# Patient Record
Sex: Male | Born: 1935
Health system: Southern US, Community
[De-identification: ages and names within clinical notes are randomized; demographics above are authoritative.]

## PROBLEM LIST (undated history)

## (undated) DIAGNOSIS — C7951 Secondary malignant neoplasm of bone: Secondary | ICD-10-CM

## (undated) DIAGNOSIS — I10 Essential (primary) hypertension: Secondary | ICD-10-CM

## (undated) DIAGNOSIS — R42 Dizziness and giddiness: Secondary | ICD-10-CM

## (undated) DIAGNOSIS — Z8546 Personal history of malignant neoplasm of prostate: Secondary | ICD-10-CM

## (undated) DIAGNOSIS — M810 Age-related osteoporosis without current pathological fracture: Secondary | ICD-10-CM

## (undated) DIAGNOSIS — R011 Cardiac murmur, unspecified: Secondary | ICD-10-CM

## (undated) HISTORY — DX: Essential (primary) hypertension: I10

## (undated) HISTORY — DX: Secondary malignant neoplasm of bone: C79.51

## (undated) HISTORY — PX: BIOPSY PROSTATE: PRO28

## (undated) HISTORY — DX: Cardiac murmur, unspecified: R01.1

## (undated) HISTORY — DX: Personal history of malignant neoplasm of prostate: Z85.46

## (undated) HISTORY — DX: Dizziness and giddiness: R42

## (undated) HISTORY — DX: Age-related osteoporosis without current pathological fracture: M81.0

---

## 1999-10-10 DIAGNOSIS — Z8546 Personal history of malignant neoplasm of prostate: Secondary | ICD-10-CM

## 1999-10-10 HISTORY — DX: Personal history of malignant neoplasm of prostate: Z85.46

## 2012-04-18 ENCOUNTER — Encounter: Payer: Medicare Other | Admitting: Hematology and Oncology

## 2012-04-18 DIAGNOSIS — I10 Essential (primary) hypertension: Secondary | ICD-10-CM

## 2012-04-18 DIAGNOSIS — M810 Age-related osteoporosis without current pathological fracture: Secondary | ICD-10-CM

## 2012-04-18 DIAGNOSIS — C7951 Secondary malignant neoplasm of bone: Secondary | ICD-10-CM

## 2012-04-18 DIAGNOSIS — C7952 Secondary malignant neoplasm of bone marrow: Secondary | ICD-10-CM

## 2012-04-18 DIAGNOSIS — C61 Malignant neoplasm of prostate: Secondary | ICD-10-CM

## 2012-06-25 ENCOUNTER — Encounter: Payer: Medicare Other | Admitting: Hematology and Oncology

## 2012-07-03 ENCOUNTER — Encounter: Payer: Medicare Other | Admitting: Hematology and Oncology

## 2012-07-03 DIAGNOSIS — I10 Essential (primary) hypertension: Secondary | ICD-10-CM

## 2012-07-03 DIAGNOSIS — C61 Malignant neoplasm of prostate: Secondary | ICD-10-CM

## 2012-07-03 DIAGNOSIS — M810 Age-related osteoporosis without current pathological fracture: Secondary | ICD-10-CM

## 2012-07-30 DIAGNOSIS — C61 Malignant neoplasm of prostate: Secondary | ICD-10-CM

## 2012-07-30 DIAGNOSIS — M810 Age-related osteoporosis without current pathological fracture: Secondary | ICD-10-CM

## 2012-10-16 ENCOUNTER — Encounter: Payer: Medicare Other | Admitting: Internal Medicine

## 2012-10-16 DIAGNOSIS — C61 Malignant neoplasm of prostate: Secondary | ICD-10-CM

## 2013-01-03 DIAGNOSIS — C7951 Secondary malignant neoplasm of bone: Secondary | ICD-10-CM

## 2013-01-03 DIAGNOSIS — C7952 Secondary malignant neoplasm of bone marrow: Secondary | ICD-10-CM

## 2013-01-03 DIAGNOSIS — C61 Malignant neoplasm of prostate: Secondary | ICD-10-CM

## 2013-01-16 DIAGNOSIS — C61 Malignant neoplasm of prostate: Secondary | ICD-10-CM

## 2013-01-16 DIAGNOSIS — M810 Age-related osteoporosis without current pathological fracture: Secondary | ICD-10-CM

## 2013-01-16 DIAGNOSIS — Z5111 Encounter for antineoplastic chemotherapy: Secondary | ICD-10-CM

## 2013-04-07 ENCOUNTER — Encounter: Payer: Medicare Other | Admitting: Internal Medicine

## 2013-04-07 DIAGNOSIS — M810 Age-related osteoporosis without current pathological fracture: Secondary | ICD-10-CM

## 2013-04-07 DIAGNOSIS — Z5111 Encounter for antineoplastic chemotherapy: Secondary | ICD-10-CM

## 2013-04-07 DIAGNOSIS — C7952 Secondary malignant neoplasm of bone marrow: Secondary | ICD-10-CM

## 2013-04-07 DIAGNOSIS — C61 Malignant neoplasm of prostate: Secondary | ICD-10-CM

## 2013-04-07 DIAGNOSIS — C7951 Secondary malignant neoplasm of bone: Secondary | ICD-10-CM

## 2013-06-30 DIAGNOSIS — Z5111 Encounter for antineoplastic chemotherapy: Secondary | ICD-10-CM

## 2013-06-30 DIAGNOSIS — C7952 Secondary malignant neoplasm of bone marrow: Secondary | ICD-10-CM

## 2013-06-30 DIAGNOSIS — C7951 Secondary malignant neoplasm of bone: Secondary | ICD-10-CM

## 2013-06-30 DIAGNOSIS — C61 Malignant neoplasm of prostate: Secondary | ICD-10-CM

## 2014-12-30 DIAGNOSIS — D485 Neoplasm of uncertain behavior of skin: Secondary | ICD-10-CM | POA: Diagnosis not present

## 2014-12-30 DIAGNOSIS — L821 Other seborrheic keratosis: Secondary | ICD-10-CM | POA: Diagnosis not present

## 2014-12-30 DIAGNOSIS — L570 Actinic keratosis: Secondary | ICD-10-CM | POA: Diagnosis not present

## 2014-12-31 DIAGNOSIS — C61 Malignant neoplasm of prostate: Secondary | ICD-10-CM | POA: Diagnosis not present

## 2014-12-31 DIAGNOSIS — C7951 Secondary malignant neoplasm of bone: Secondary | ICD-10-CM | POA: Diagnosis not present

## 2014-12-31 DIAGNOSIS — M810 Age-related osteoporosis without current pathological fracture: Secondary | ICD-10-CM | POA: Diagnosis not present

## 2015-04-02 DIAGNOSIS — C7951 Secondary malignant neoplasm of bone: Secondary | ICD-10-CM | POA: Diagnosis not present

## 2015-04-02 DIAGNOSIS — C61 Malignant neoplasm of prostate: Secondary | ICD-10-CM | POA: Diagnosis not present

## 2015-07-02 DIAGNOSIS — M810 Age-related osteoporosis without current pathological fracture: Secondary | ICD-10-CM | POA: Diagnosis not present

## 2015-07-02 DIAGNOSIS — C61 Malignant neoplasm of prostate: Secondary | ICD-10-CM | POA: Diagnosis not present

## 2015-07-02 DIAGNOSIS — C7951 Secondary malignant neoplasm of bone: Secondary | ICD-10-CM | POA: Diagnosis not present

## 2015-07-07 DIAGNOSIS — M810 Age-related osteoporosis without current pathological fracture: Secondary | ICD-10-CM | POA: Diagnosis not present

## 2015-07-07 DIAGNOSIS — C61 Malignant neoplasm of prostate: Secondary | ICD-10-CM | POA: Diagnosis not present

## 2015-07-07 DIAGNOSIS — C7951 Secondary malignant neoplasm of bone: Secondary | ICD-10-CM | POA: Diagnosis not present

## 2015-07-07 DIAGNOSIS — Z23 Encounter for immunization: Secondary | ICD-10-CM | POA: Diagnosis not present

## 2015-07-19 DIAGNOSIS — Z87891 Personal history of nicotine dependence: Secondary | ICD-10-CM | POA: Diagnosis not present

## 2015-07-19 DIAGNOSIS — C61 Malignant neoplasm of prostate: Secondary | ICD-10-CM | POA: Diagnosis not present

## 2015-07-19 DIAGNOSIS — I10 Essential (primary) hypertension: Secondary | ICD-10-CM | POA: Diagnosis not present

## 2015-07-19 DIAGNOSIS — Z923 Personal history of irradiation: Secondary | ICD-10-CM | POA: Diagnosis not present

## 2015-07-19 DIAGNOSIS — Z9221 Personal history of antineoplastic chemotherapy: Secondary | ICD-10-CM | POA: Diagnosis not present

## 2015-07-19 DIAGNOSIS — M810 Age-related osteoporosis without current pathological fracture: Secondary | ICD-10-CM | POA: Diagnosis not present

## 2015-10-01 DIAGNOSIS — Z5111 Encounter for antineoplastic chemotherapy: Secondary | ICD-10-CM | POA: Diagnosis not present

## 2015-10-01 DIAGNOSIS — C61 Malignant neoplasm of prostate: Secondary | ICD-10-CM | POA: Diagnosis not present

## 2015-12-30 DIAGNOSIS — Z5111 Encounter for antineoplastic chemotherapy: Secondary | ICD-10-CM | POA: Diagnosis not present

## 2015-12-30 DIAGNOSIS — Z5181 Encounter for therapeutic drug level monitoring: Secondary | ICD-10-CM | POA: Diagnosis not present

## 2015-12-30 DIAGNOSIS — M810 Age-related osteoporosis without current pathological fracture: Secondary | ICD-10-CM | POA: Diagnosis not present

## 2015-12-30 DIAGNOSIS — Z79899 Other long term (current) drug therapy: Secondary | ICD-10-CM | POA: Diagnosis not present

## 2015-12-30 DIAGNOSIS — C61 Malignant neoplasm of prostate: Secondary | ICD-10-CM | POA: Diagnosis not present

## 2015-12-30 DIAGNOSIS — C7951 Secondary malignant neoplasm of bone: Secondary | ICD-10-CM | POA: Diagnosis not present

## 2016-03-29 DIAGNOSIS — Z5181 Encounter for therapeutic drug level monitoring: Secondary | ICD-10-CM | POA: Diagnosis not present

## 2016-03-29 DIAGNOSIS — Z79899 Other long term (current) drug therapy: Secondary | ICD-10-CM | POA: Diagnosis not present

## 2016-03-29 DIAGNOSIS — M818 Other osteoporosis without current pathological fracture: Secondary | ICD-10-CM | POA: Diagnosis not present

## 2016-03-29 DIAGNOSIS — C61 Malignant neoplasm of prostate: Secondary | ICD-10-CM | POA: Diagnosis not present

## 2016-03-29 DIAGNOSIS — R339 Retention of urine, unspecified: Secondary | ICD-10-CM | POA: Diagnosis not present

## 2016-03-29 DIAGNOSIS — Z5111 Encounter for antineoplastic chemotherapy: Secondary | ICD-10-CM | POA: Diagnosis not present

## 2016-04-24 DIAGNOSIS — R5383 Other fatigue: Secondary | ICD-10-CM | POA: Diagnosis not present

## 2016-04-24 DIAGNOSIS — C61 Malignant neoplasm of prostate: Secondary | ICD-10-CM | POA: Diagnosis not present

## 2016-04-24 DIAGNOSIS — Z6821 Body mass index (BMI) 21.0-21.9, adult: Secondary | ICD-10-CM | POA: Diagnosis not present

## 2016-04-24 DIAGNOSIS — M810 Age-related osteoporosis without current pathological fracture: Secondary | ICD-10-CM | POA: Diagnosis not present

## 2016-04-24 DIAGNOSIS — I10 Essential (primary) hypertension: Secondary | ICD-10-CM | POA: Diagnosis not present

## 2016-04-24 DIAGNOSIS — I493 Ventricular premature depolarization: Secondary | ICD-10-CM | POA: Diagnosis not present

## 2016-05-01 DIAGNOSIS — R3912 Poor urinary stream: Secondary | ICD-10-CM | POA: Diagnosis not present

## 2016-05-01 DIAGNOSIS — M8589 Other specified disorders of bone density and structure, multiple sites: Secondary | ICD-10-CM | POA: Diagnosis not present

## 2016-05-01 DIAGNOSIS — C61 Malignant neoplasm of prostate: Secondary | ICD-10-CM | POA: Diagnosis not present

## 2016-06-19 DIAGNOSIS — Z6821 Body mass index (BMI) 21.0-21.9, adult: Secondary | ICD-10-CM | POA: Diagnosis not present

## 2016-06-19 DIAGNOSIS — Z1389 Encounter for screening for other disorder: Secondary | ICD-10-CM | POA: Diagnosis not present

## 2016-06-19 DIAGNOSIS — I493 Ventricular premature depolarization: Secondary | ICD-10-CM | POA: Diagnosis not present

## 2016-06-19 DIAGNOSIS — Z23 Encounter for immunization: Secondary | ICD-10-CM | POA: Diagnosis not present

## 2016-06-19 DIAGNOSIS — R5383 Other fatigue: Secondary | ICD-10-CM | POA: Diagnosis not present

## 2016-06-19 DIAGNOSIS — C61 Malignant neoplasm of prostate: Secondary | ICD-10-CM | POA: Diagnosis not present

## 2016-06-19 DIAGNOSIS — M810 Age-related osteoporosis without current pathological fracture: Secondary | ICD-10-CM | POA: Diagnosis not present

## 2016-06-19 DIAGNOSIS — I10 Essential (primary) hypertension: Secondary | ICD-10-CM | POA: Diagnosis not present

## 2016-06-21 DIAGNOSIS — C44319 Basal cell carcinoma of skin of other parts of face: Secondary | ICD-10-CM | POA: Diagnosis not present

## 2016-06-30 DIAGNOSIS — C61 Malignant neoplasm of prostate: Secondary | ICD-10-CM | POA: Diagnosis not present

## 2016-07-04 DIAGNOSIS — R3912 Poor urinary stream: Secondary | ICD-10-CM | POA: Diagnosis not present

## 2016-07-04 DIAGNOSIS — C61 Malignant neoplasm of prostate: Secondary | ICD-10-CM | POA: Diagnosis not present

## 2016-07-12 ENCOUNTER — Ambulatory Visit (INDEPENDENT_AMBULATORY_CARE_PROVIDER_SITE_OTHER): Payer: Medicare Other | Admitting: Cardiovascular Disease

## 2016-07-12 ENCOUNTER — Other Ambulatory Visit: Payer: Self-pay | Admitting: *Deleted

## 2016-07-12 ENCOUNTER — Encounter: Payer: Self-pay | Admitting: *Deleted

## 2016-07-12 VITALS — BP 90/58 | HR 96 | Ht 68.0 in | Wt 137.0 lb

## 2016-07-12 DIAGNOSIS — R011 Cardiac murmur, unspecified: Secondary | ICD-10-CM

## 2016-07-12 DIAGNOSIS — C61 Malignant neoplasm of prostate: Secondary | ICD-10-CM | POA: Diagnosis not present

## 2016-07-12 DIAGNOSIS — I951 Orthostatic hypotension: Secondary | ICD-10-CM

## 2016-07-12 DIAGNOSIS — R42 Dizziness and giddiness: Secondary | ICD-10-CM

## 2016-07-12 DIAGNOSIS — I493 Ventricular premature depolarization: Secondary | ICD-10-CM | POA: Diagnosis not present

## 2016-07-12 NOTE — Patient Instructions (Addendum)
Medication Instructions:   Stop Lasix (Furosemide).  Stop Potassium (K-Dur, Klor-Con).  Stop Flomax (Tamsulosin).  Continue all other medications.    Labwork: none  Testing/Procedures:  Your physician has requested that you have an echocardiogram. Echocardiography is a painless test that uses sound waves to create images of your heart. It provides your doctor with information about the size and shape of your heart and how well your heart's chambers and valves are working. This procedure takes approximately one hour. There are no restrictions for this procedure.  Office will contact with results via phone or letter.    Follow-Up: 2 months   Any Other Special Instructions Will Be Listed Below (If Applicable).  If you need a refill on your cardiac medications before your next appointment, please call your pharmacy.

## 2016-07-12 NOTE — Progress Notes (Signed)
CARDIOLOGY CONSULT NOTE  Patient ID: Bryan Ho MRN: [id number] DOB/AGE: 05/16/1923 82 y.o.  Admit date: (Not on file) Primary Physician: Manon Hilding, MD Referring Physician:   Reason for Consultation: dizziness  HPI: 82 yr old referred for dizziness. ECG in July showed NSR with PVC's occasionally in couplets.  Reportedly started on Flomax by urology and dizziness became much worse. He denies shortness of breath, chest pain, palpitations, and syncope. He was started on Lasix 40 mg 7 or 8 years ago and this dose hasn't changed. He wears compression stockings. He has a history of prostate cancer diagnosed in the 41s. He has also been on long-acting diltiazem twice daily and says he has been on the same dose for many, many years.  Blood pressure sitting 121/73, standing 90/58, heart rate 96 bpm.    No Known Allergies  Current Outpatient Prescriptions  Medication Sig Dispense Refill  . bicalutamide (CASODEX) 50 MG tablet Take 1 tablet by mouth daily.    Marland Kitchen DILT-XR 180 MG 24 hr capsule Take 1 capsule by mouth 2 (two) times daily.    . Melatonin 10 MG TABS Take 1 tablet by mouth at bedtime.     No current facility-administered medications for this visit.     Past Medical History:  Diagnosis Date  . Bone metastases ([place])   . Cardiac murmur   . Dizziness and giddiness   . History of adenocarcinoma of prostate 2001  . Hypertension   . Osteoporosis     Past Surgical History:  Procedure Laterality Date  . BIOPSY PROSTATE     20 years ago, cancer bx    Social History   Social History  . Marital status: Married    Spouse name: N/A  . Number of children: N/A  . Years of education: N/A   Occupational History  . Not on file.   Social History Main Topics  . Smoking status: Former Smoker    Packs/day: 3.00    Years: 40.00    Types: Cigarettes    Start date: 06/12/1937    Quit date: 06/12/1977  . Smokeless tobacco: Current User     Comment: starting dipping  snuff at age 50   . Alcohol use Not on file  . Drug use: Unknown  . Sexual activity: Not on file   Other Topics Concern  . Not on file   Social History Narrative  . No narrative on file     No family history of premature CAD in 1st degree relatives.  Prior to Admission medications   Medication Sig Start Date End Date Taking? Authorizing Provider  bicalutamide (CASODEX) 50 MG tablet Take 1 tablet by mouth daily. 07/04/16   Historical Provider, MD  DILT-XR 180 MG 24 hr capsule Take 1 capsule by mouth 2 (two) times daily. 06/21/16   Historical Provider, MD  furosemide (LASIX) 40 MG tablet Take 1 tablet by mouth daily. 06/20/16   Historical Provider, MD  potassium chloride SA (K-DUR,KLOR-CON) 20 MEQ tablet Take 1 tablet by mouth daily. 05/26/16   Historical Provider, MD  tamsulosin (FLOMAX) 0.4 MG CAPS capsule Take 1 capsule by mouth daily. 05/01/16   Historical Provider, MD     Review of systems complete and found to be negative unless listed above in HPI     Physical exam Blood pressure (!) 90/58, pulse 96, height 5\' 8"  (1.727 m), weight 137 lb (62.1 kg). General: NAD Neck: No JVD, no thyromegaly or thyroid nodule.  Lungs: Clear to auscultation bilaterally with normal respiratory effort. CV: Nondisplaced PMI. Distant tones. Regular rate and mostly regular rhythm with premature contractions, normal S1/S2, no S3/S4, 2/6 ejection systolic murmur over RUSB.  Trace peripheral edema. Abdomen: Soft, nontender, no distention.  Skin: Intact without lesions or rashes.  Neurologic: Alert and oriented.  Psych: Normal affect.  ECG: Most recent ECG reviewed.  Labs:  No results found for: WBC, HGB, HCT, MCV, PLT No results for input(s): NA, K, CL, CO2, BUN, CREATININE, CALCIUM, PROT, BILITOT, ALKPHOS, ALT, AST, GLUCOSE in the last 168 hours.  Invalid input(s): LABALBU No results found for: CKTOTAL, CKMB, CKMBINDEX, TROPONINI No results found for: CHOL No results found for: HDL No results  found for: LDLCALC No results found for: TRIG No results found for: CHOLHDL No results found for: LDLDIRECT       Studies: No results found.  ASSESSMENT AND PLAN:  1. Dizziness/hypotension: Orthostatics noted above. On both Lasix 40 mg daily and long acting diltazem 180 mg BID. Symptoms became worse with Flomax. I have encouraged him to stop both Lasix and Flomax.  I will order a 2-D echocardiogram with Doppler to evaluate cardiac structure, function, and regional wall motion.  2. Cardiac murmur: I will order a 2-D echocardiogram with Doppler to evaluate cardiac structure, function, and regional wall motion.  3. PVC's: Asymptomatic on diltiazem.   Dispo: fu 2 months.     Signed: Kate Sable, M.D., F.A.C.C.  07/12/2016, 2:22 PM

## 2016-08-03 ENCOUNTER — Ambulatory Visit (INDEPENDENT_AMBULATORY_CARE_PROVIDER_SITE_OTHER): Payer: Medicare Other

## 2016-08-03 ENCOUNTER — Other Ambulatory Visit: Payer: Self-pay

## 2016-08-03 DIAGNOSIS — R011 Cardiac murmur, unspecified: Secondary | ICD-10-CM | POA: Diagnosis not present

## 2016-08-08 ENCOUNTER — Telehealth: Payer: Self-pay | Admitting: *Deleted

## 2016-08-08 NOTE — Telephone Encounter (Signed)
Notes Recorded by Laurine Blazer, LPN on [id number] at 10:15 AM EDT Bryan Ho (wife) notified. Copy to pmd. Follow up scheduled for 09/11/2016 with Dr. Bronson Ing. ------  Notes Recorded by Arnoldo Lenis, MD on 08/07/2016 at 3:15 PM EDT Echo overeall looks good. He has mild to moderate changes of 2 heart valves that are not of a concern at this time and will be continued to be monitored. Dr Raliegh Ip to discuss further at f/u

## 2016-09-11 ENCOUNTER — Ambulatory Visit (INDEPENDENT_AMBULATORY_CARE_PROVIDER_SITE_OTHER): Payer: Medicare Other | Admitting: Cardiovascular Disease

## 2016-09-11 ENCOUNTER — Encounter: Payer: Self-pay | Admitting: Cardiovascular Disease

## 2016-09-11 VITALS — BP 136/72 | HR 87 | Ht 68.0 in | Wt 147.0 lb

## 2016-09-11 DIAGNOSIS — I350 Nonrheumatic aortic (valve) stenosis: Secondary | ICD-10-CM | POA: Diagnosis not present

## 2016-09-11 DIAGNOSIS — I951 Orthostatic hypotension: Secondary | ICD-10-CM | POA: Diagnosis not present

## 2016-09-11 DIAGNOSIS — R42 Dizziness and giddiness: Secondary | ICD-10-CM | POA: Diagnosis not present

## 2016-09-11 DIAGNOSIS — I493 Ventricular premature depolarization: Secondary | ICD-10-CM

## 2016-09-11 NOTE — Patient Instructions (Signed)
Medication Instructions:  Continue all current medications.  Labwork: none  Testing/Procedures: none  Follow-Up: As needed.    Any Other Special Instructions Will Be Listed Below (If Applicable).  If you need a refill on your cardiac medications before your next appointment, please call your pharmacy.  

## 2016-09-11 NOTE — Progress Notes (Signed)
      SUBJECTIVE: The patient returns for follow-up after undergoing echocardiography. This showed normal left ventricular systolic function and regional wall motion, LVEF 55-60%, mild to moderate aortic stenosis, mild mitral regurgitation, and moderately elevated pulmonary pressures 50 mmHg.  He stopped taking Lasix and he is now taking Flomax every other day and his dizziness has markedly improved.   Review of Systems: As per "subjective", otherwise negative.  No Known Allergies  Current Outpatient Prescriptions  Medication Sig Dispense Refill  . DILT-XR 180 MG 24 hr capsule Take 1 capsule by mouth 2 (two) times daily.    . tamsulosin (FLOMAX) 0.4 MG CAPS capsule Take 1 capsule by mouth daily.     No current facility-administered medications for this visit.     Past Medical History:  Diagnosis Date  . Bone metastases ([place])   . Cardiac murmur   . Dizziness and giddiness   . History of adenocarcinoma of prostate 2001  . Hypertension   . Osteoporosis     Past Surgical History:  Procedure Laterality Date  . BIOPSY PROSTATE     20 years ago, cancer bx    Social History   Social History  . Marital status: Married    Spouse name: N/A  . Number of children: N/A  . Years of education: N/A   Occupational History  . Not on file.   Social History Main Topics  . Smoking status: Former Smoker    Packs/day: 3.00    Years: 40.00    Types: Cigarettes    Start date: 06/12/1937    Quit date: 06/12/1977  . Smokeless tobacco: Current User    Types: Snuff     Comment: starting dipping snuff at age 49   . Alcohol use Not on file  . Drug use: Unknown  . Sexual activity: Not on file   Other Topics Concern  . Not on file   Social History Narrative  . No narrative on file     Vitals:   09/11/16 1322  BP: 136/72  Pulse: 87  Weight: 147 lb (66.7 kg)  Height: 5\' 8"  (1.727 m)    PHYSICAL EXAM General: NAD Neck: No JVD, no thyromegaly or thyroid nodule.  Lungs: Clear to  auscultation bilaterally with normal respiratory effort. CV: Nondisplaced PMI. Distant tones. Regular rate and mostly regular rhythm with premature contractions, normal S1/S2, no S3/S4, 2/6 ejection systolic murmur over RUSB.  Trace peripheral edema. Abdomen: Soft, nontender, no distention.  Skin: Intact without lesions or rashes.  Neurologic: Alert and oriented.  Psych: Normal affect.     ECG: Most recent ECG reviewed.      ASSESSMENT AND PLAN: 1. Dizziness/hypotension: Markedly improved with medication adjustments noted above.  2. Mild to moderate aortic stenosis: Echo can be repeated in 2-3 years if and when deemed clinically indicated.  3. PVC's: Asymptomatic on diltiazem.   Dispo: fu prn  Kate Sable, M.D., F.A.C.C.

## 2016-09-19 DIAGNOSIS — R42 Dizziness and giddiness: Secondary | ICD-10-CM | POA: Diagnosis not present

## 2016-09-19 DIAGNOSIS — H40033 Anatomical narrow angle, bilateral: Secondary | ICD-10-CM | POA: Diagnosis not present

## 2016-09-19 DIAGNOSIS — H04123 Dry eye syndrome of bilateral lacrimal glands: Secondary | ICD-10-CM | POA: Diagnosis not present

## 2016-09-19 DIAGNOSIS — R5383 Other fatigue: Secondary | ICD-10-CM | POA: Diagnosis not present

## 2016-09-19 DIAGNOSIS — M810 Age-related osteoporosis without current pathological fracture: Secondary | ICD-10-CM | POA: Diagnosis not present

## 2016-09-19 DIAGNOSIS — I10 Essential (primary) hypertension: Secondary | ICD-10-CM | POA: Diagnosis not present

## 2016-09-21 DIAGNOSIS — I10 Essential (primary) hypertension: Secondary | ICD-10-CM | POA: Diagnosis not present

## 2016-09-21 DIAGNOSIS — I493 Ventricular premature depolarization: Secondary | ICD-10-CM | POA: Diagnosis not present

## 2016-09-21 DIAGNOSIS — R011 Cardiac murmur, unspecified: Secondary | ICD-10-CM | POA: Diagnosis not present

## 2016-09-21 DIAGNOSIS — R5383 Other fatigue: Secondary | ICD-10-CM | POA: Diagnosis not present

## 2016-09-21 DIAGNOSIS — M810 Age-related osteoporosis without current pathological fracture: Secondary | ICD-10-CM | POA: Diagnosis not present

## 2016-09-21 DIAGNOSIS — R42 Dizziness and giddiness: Secondary | ICD-10-CM | POA: Diagnosis not present

## 2016-09-21 DIAGNOSIS — C61 Malignant neoplasm of prostate: Secondary | ICD-10-CM | POA: Diagnosis not present

## 2016-09-21 DIAGNOSIS — Z23 Encounter for immunization: Secondary | ICD-10-CM | POA: Diagnosis not present

## 2016-10-17 DIAGNOSIS — R972 Elevated prostate specific antigen [PSA]: Secondary | ICD-10-CM | POA: Diagnosis not present

## 2016-10-17 DIAGNOSIS — C61 Malignant neoplasm of prostate: Secondary | ICD-10-CM | POA: Diagnosis not present

## 2016-10-17 DIAGNOSIS — R3912 Poor urinary stream: Secondary | ICD-10-CM | POA: Diagnosis not present

## 2016-10-24 DIAGNOSIS — M8589 Other specified disorders of bone density and structure, multiple sites: Secondary | ICD-10-CM | POA: Diagnosis not present

## 2016-10-24 DIAGNOSIS — C61 Malignant neoplasm of prostate: Secondary | ICD-10-CM | POA: Diagnosis not present

## 2016-11-13 DIAGNOSIS — S32010A Wedge compression fracture of first lumbar vertebra, initial encounter for closed fracture: Secondary | ICD-10-CM | POA: Diagnosis not present

## 2016-11-13 DIAGNOSIS — Z6821 Body mass index (BMI) 21.0-21.9, adult: Secondary | ICD-10-CM | POA: Diagnosis not present

## 2017-03-20 DIAGNOSIS — M810 Age-related osteoporosis without current pathological fracture: Secondary | ICD-10-CM | POA: Diagnosis not present

## 2017-03-20 DIAGNOSIS — R5383 Other fatigue: Secondary | ICD-10-CM | POA: Diagnosis not present

## 2017-03-20 DIAGNOSIS — R42 Dizziness and giddiness: Secondary | ICD-10-CM | POA: Diagnosis not present

## 2017-03-20 DIAGNOSIS — I10 Essential (primary) hypertension: Secondary | ICD-10-CM | POA: Diagnosis not present

## 2017-03-26 DIAGNOSIS — R7301 Impaired fasting glucose: Secondary | ICD-10-CM | POA: Diagnosis not present

## 2017-03-26 DIAGNOSIS — I10 Essential (primary) hypertension: Secondary | ICD-10-CM | POA: Diagnosis not present

## 2017-03-26 DIAGNOSIS — R011 Cardiac murmur, unspecified: Secondary | ICD-10-CM | POA: Diagnosis not present

## 2017-03-26 DIAGNOSIS — Z6820 Body mass index (BMI) 20.0-20.9, adult: Secondary | ICD-10-CM | POA: Diagnosis not present

## 2017-03-26 DIAGNOSIS — R42 Dizziness and giddiness: Secondary | ICD-10-CM | POA: Diagnosis not present

## 2017-03-26 DIAGNOSIS — C61 Malignant neoplasm of prostate: Secondary | ICD-10-CM | POA: Diagnosis not present

## 2017-03-26 DIAGNOSIS — I493 Ventricular premature depolarization: Secondary | ICD-10-CM | POA: Diagnosis not present

## 2017-03-26 DIAGNOSIS — I890 Lymphedema, not elsewhere classified: Secondary | ICD-10-CM | POA: Diagnosis not present

## 2017-04-24 ENCOUNTER — Ambulatory Visit (INDEPENDENT_AMBULATORY_CARE_PROVIDER_SITE_OTHER): Payer: Medicare Other | Admitting: Urology

## 2017-04-24 ENCOUNTER — Other Ambulatory Visit: Payer: Self-pay | Admitting: Urology

## 2017-04-24 DIAGNOSIS — R9721 Rising PSA following treatment for malignant neoplasm of prostate: Secondary | ICD-10-CM | POA: Diagnosis not present

## 2017-04-24 DIAGNOSIS — C61 Malignant neoplasm of prostate: Secondary | ICD-10-CM

## 2017-04-24 DIAGNOSIS — Z79899 Other long term (current) drug therapy: Secondary | ICD-10-CM | POA: Diagnosis not present

## 2017-06-20 ENCOUNTER — Ambulatory Visit (HOSPITAL_COMMUNITY)
Admission: RE | Admit: 2017-06-20 | Discharge: 2017-06-20 | Disposition: A | Discharge status: Home / Self Care | Payer: Medicare Other | Source: Ambulatory Visit | Attending: Urology | Admitting: Urology

## 2017-06-20 DIAGNOSIS — C61 Malignant neoplasm of prostate: Secondary | ICD-10-CM | POA: Diagnosis not present

## 2017-06-20 DIAGNOSIS — M810 Age-related osteoporosis without current pathological fracture: Secondary | ICD-10-CM | POA: Diagnosis not present

## 2017-07-23 DIAGNOSIS — C4490 Unspecified malignant neoplasm of skin, unspecified: Secondary | ICD-10-CM | POA: Diagnosis not present

## 2017-07-23 DIAGNOSIS — R634 Abnormal weight loss: Secondary | ICD-10-CM | POA: Diagnosis not present

## 2017-07-23 DIAGNOSIS — R7301 Impaired fasting glucose: Secondary | ICD-10-CM | POA: Diagnosis not present

## 2017-07-23 DIAGNOSIS — I493 Ventricular premature depolarization: Secondary | ICD-10-CM | POA: Diagnosis not present

## 2017-07-23 DIAGNOSIS — I890 Lymphedema, not elsewhere classified: Secondary | ICD-10-CM | POA: Diagnosis not present

## 2017-07-23 DIAGNOSIS — M810 Age-related osteoporosis without current pathological fracture: Secondary | ICD-10-CM | POA: Diagnosis not present

## 2017-07-23 DIAGNOSIS — R011 Cardiac murmur, unspecified: Secondary | ICD-10-CM | POA: Diagnosis not present

## 2017-07-23 DIAGNOSIS — C61 Malignant neoplasm of prostate: Secondary | ICD-10-CM | POA: Diagnosis not present

## 2017-07-23 DIAGNOSIS — R5383 Other fatigue: Secondary | ICD-10-CM | POA: Diagnosis not present

## 2017-07-23 DIAGNOSIS — I10 Essential (primary) hypertension: Secondary | ICD-10-CM | POA: Diagnosis not present

## 2017-07-25 DIAGNOSIS — I493 Ventricular premature depolarization: Secondary | ICD-10-CM | POA: Diagnosis not present

## 2017-07-25 DIAGNOSIS — E039 Hypothyroidism, unspecified: Secondary | ICD-10-CM | POA: Diagnosis not present

## 2017-07-25 DIAGNOSIS — I10 Essential (primary) hypertension: Secondary | ICD-10-CM | POA: Diagnosis not present

## 2017-07-25 DIAGNOSIS — Z23 Encounter for immunization: Secondary | ICD-10-CM | POA: Diagnosis not present

## 2017-07-25 DIAGNOSIS — R42 Dizziness and giddiness: Secondary | ICD-10-CM | POA: Diagnosis not present

## 2017-07-25 DIAGNOSIS — R5383 Other fatigue: Secondary | ICD-10-CM | POA: Diagnosis not present

## 2017-07-25 DIAGNOSIS — R011 Cardiac murmur, unspecified: Secondary | ICD-10-CM | POA: Diagnosis not present

## 2017-07-25 DIAGNOSIS — C61 Malignant neoplasm of prostate: Secondary | ICD-10-CM | POA: Diagnosis not present

## 2017-07-31 ENCOUNTER — Ambulatory Visit (INDEPENDENT_AMBULATORY_CARE_PROVIDER_SITE_OTHER): Payer: Medicare Other | Admitting: Urology

## 2017-07-31 DIAGNOSIS — M818 Other osteoporosis without current pathological fracture: Secondary | ICD-10-CM

## 2017-07-31 DIAGNOSIS — R9721 Rising PSA following treatment for malignant neoplasm of prostate: Secondary | ICD-10-CM | POA: Diagnosis not present

## 2017-07-31 DIAGNOSIS — Z79899 Other long term (current) drug therapy: Secondary | ICD-10-CM

## 2017-07-31 DIAGNOSIS — C61 Malignant neoplasm of prostate: Secondary | ICD-10-CM | POA: Diagnosis not present

## 2017-11-01 DIAGNOSIS — C61 Malignant neoplasm of prostate: Secondary | ICD-10-CM | POA: Diagnosis not present

## 2017-11-13 ENCOUNTER — Ambulatory Visit (INDEPENDENT_AMBULATORY_CARE_PROVIDER_SITE_OTHER): Payer: Medicare Other | Admitting: Urology

## 2017-11-13 DIAGNOSIS — R351 Nocturia: Secondary | ICD-10-CM | POA: Diagnosis not present

## 2017-11-13 DIAGNOSIS — N401 Enlarged prostate with lower urinary tract symptoms: Secondary | ICD-10-CM | POA: Diagnosis not present

## 2017-11-13 DIAGNOSIS — R9721 Rising PSA following treatment for malignant neoplasm of prostate: Secondary | ICD-10-CM

## 2017-11-13 DIAGNOSIS — M818 Other osteoporosis without current pathological fracture: Secondary | ICD-10-CM

## 2017-11-15 ENCOUNTER — Other Ambulatory Visit: Payer: Self-pay | Admitting: Urology

## 2017-11-15 DIAGNOSIS — C61 Malignant neoplasm of prostate: Secondary | ICD-10-CM

## 2017-11-16 ENCOUNTER — Other Ambulatory Visit: Payer: Self-pay | Admitting: Urology

## 2017-11-16 DIAGNOSIS — C61 Malignant neoplasm of prostate: Secondary | ICD-10-CM

## 2017-11-19 DIAGNOSIS — E039 Hypothyroidism, unspecified: Secondary | ICD-10-CM | POA: Diagnosis not present

## 2017-11-19 DIAGNOSIS — I10 Essential (primary) hypertension: Secondary | ICD-10-CM | POA: Diagnosis not present

## 2017-11-19 DIAGNOSIS — R7301 Impaired fasting glucose: Secondary | ICD-10-CM | POA: Diagnosis not present

## 2017-11-21 DIAGNOSIS — Z23 Encounter for immunization: Secondary | ICD-10-CM | POA: Diagnosis not present

## 2017-11-21 DIAGNOSIS — I890 Lymphedema, not elsewhere classified: Secondary | ICD-10-CM | POA: Diagnosis not present

## 2017-11-21 DIAGNOSIS — R011 Cardiac murmur, unspecified: Secondary | ICD-10-CM | POA: Diagnosis not present

## 2017-11-21 DIAGNOSIS — R7301 Impaired fasting glucose: Secondary | ICD-10-CM | POA: Diagnosis not present

## 2017-11-21 DIAGNOSIS — C61 Malignant neoplasm of prostate: Secondary | ICD-10-CM | POA: Diagnosis not present

## 2017-11-21 DIAGNOSIS — Z6821 Body mass index (BMI) 21.0-21.9, adult: Secondary | ICD-10-CM | POA: Diagnosis not present

## 2017-11-21 DIAGNOSIS — R5383 Other fatigue: Secondary | ICD-10-CM | POA: Diagnosis not present

## 2017-11-21 DIAGNOSIS — I10 Essential (primary) hypertension: Secondary | ICD-10-CM | POA: Diagnosis not present

## 2017-11-22 ENCOUNTER — Other Ambulatory Visit (HOSPITAL_COMMUNITY): Payer: Medicare Other

## 2017-11-22 ENCOUNTER — Encounter (HOSPITAL_COMMUNITY): Payer: Self-pay

## 2017-11-22 ENCOUNTER — Encounter (HOSPITAL_COMMUNITY)
Admission: RE | Admit: 2017-11-22 | Discharge: 2017-11-22 | Disposition: A | Discharge status: Home / Self Care | Payer: Medicare Other | Source: Ambulatory Visit | Attending: Urology | Admitting: Urology

## 2017-11-22 ENCOUNTER — Ambulatory Visit (HOSPITAL_COMMUNITY)
Admission: RE | Admit: 2017-11-22 | Discharge: 2017-11-22 | Disposition: A | Discharge status: Home / Self Care | Payer: Medicare Other | Source: Ambulatory Visit | Attending: Urology | Admitting: Urology

## 2017-11-22 DIAGNOSIS — R948 Abnormal results of function studies of other organs and systems: Secondary | ICD-10-CM | POA: Diagnosis not present

## 2017-11-22 DIAGNOSIS — C61 Malignant neoplasm of prostate: Secondary | ICD-10-CM

## 2017-11-22 DIAGNOSIS — S32019A Unspecified fracture of first lumbar vertebra, initial encounter for closed fracture: Secondary | ICD-10-CM | POA: Insufficient documentation

## 2017-11-22 DIAGNOSIS — R918 Other nonspecific abnormal finding of lung field: Secondary | ICD-10-CM | POA: Diagnosis not present

## 2017-11-22 DIAGNOSIS — K869 Disease of pancreas, unspecified: Secondary | ICD-10-CM | POA: Diagnosis not present

## 2017-11-22 DIAGNOSIS — X58XXXA Exposure to other specified factors, initial encounter: Secondary | ICD-10-CM | POA: Insufficient documentation

## 2017-11-22 LAB — POCT I-STAT CREATININE: Creatinine, Ser: 1.1 mg/dL (ref 0.61–1.24)

## 2017-11-22 MED ORDER — TECHNETIUM TC 99M MEDRONATE IV KIT
20.0000 | PACK | Freq: Once | INTRAVENOUS | Status: AC | PRN
  Administered 2017-11-22: 21.7 via INTRAVENOUS

## 2017-11-22 MED ORDER — IOPAMIDOL (ISOVUE-300) INJECTION 61%
100.0000 mL | Freq: Once | INTRAVENOUS | Status: AC | PRN
  Administered 2017-11-22: 100 mL via INTRAVENOUS

## 2018-01-15 ENCOUNTER — Ambulatory Visit (INDEPENDENT_AMBULATORY_CARE_PROVIDER_SITE_OTHER): Payer: Medicare Other | Admitting: Urology

## 2018-01-15 DIAGNOSIS — C61 Malignant neoplasm of prostate: Secondary | ICD-10-CM | POA: Diagnosis not present

## 2018-01-15 DIAGNOSIS — R3914 Feeling of incomplete bladder emptying: Secondary | ICD-10-CM | POA: Diagnosis not present

## 2018-01-15 DIAGNOSIS — R9721 Rising PSA following treatment for malignant neoplasm of prostate: Secondary | ICD-10-CM | POA: Diagnosis not present

## 2018-01-15 DIAGNOSIS — M818 Other osteoporosis without current pathological fracture: Secondary | ICD-10-CM

## 2018-01-15 DIAGNOSIS — N401 Enlarged prostate with lower urinary tract symptoms: Secondary | ICD-10-CM

## 2018-03-27 DIAGNOSIS — C61 Malignant neoplasm of prostate: Secondary | ICD-10-CM | POA: Diagnosis not present

## 2018-04-02 ENCOUNTER — Ambulatory Visit (INDEPENDENT_AMBULATORY_CARE_PROVIDER_SITE_OTHER): Payer: Medicare Other | Admitting: Urology

## 2018-04-02 DIAGNOSIS — R3914 Feeling of incomplete bladder emptying: Secondary | ICD-10-CM

## 2018-04-02 DIAGNOSIS — C61 Malignant neoplasm of prostate: Secondary | ICD-10-CM | POA: Diagnosis not present

## 2018-05-20 DIAGNOSIS — R5383 Other fatigue: Secondary | ICD-10-CM | POA: Diagnosis not present

## 2018-05-20 DIAGNOSIS — E039 Hypothyroidism, unspecified: Secondary | ICD-10-CM | POA: Diagnosis not present

## 2018-05-20 DIAGNOSIS — R634 Abnormal weight loss: Secondary | ICD-10-CM | POA: Diagnosis not present

## 2018-05-20 DIAGNOSIS — M810 Age-related osteoporosis without current pathological fracture: Secondary | ICD-10-CM | POA: Diagnosis not present

## 2018-05-20 DIAGNOSIS — R7301 Impaired fasting glucose: Secondary | ICD-10-CM | POA: Diagnosis not present

## 2018-05-20 DIAGNOSIS — I10 Essential (primary) hypertension: Secondary | ICD-10-CM | POA: Diagnosis not present

## 2018-05-22 DIAGNOSIS — N183 Chronic kidney disease, stage 3 (moderate): Secondary | ICD-10-CM | POA: Diagnosis not present

## 2018-05-22 DIAGNOSIS — I890 Lymphedema, not elsewhere classified: Secondary | ICD-10-CM | POA: Diagnosis not present

## 2018-05-22 DIAGNOSIS — R42 Dizziness and giddiness: Secondary | ICD-10-CM | POA: Diagnosis not present

## 2018-05-22 DIAGNOSIS — R5383 Other fatigue: Secondary | ICD-10-CM | POA: Diagnosis not present

## 2018-05-22 DIAGNOSIS — R634 Abnormal weight loss: Secondary | ICD-10-CM | POA: Diagnosis not present

## 2018-05-22 DIAGNOSIS — R7301 Impaired fasting glucose: Secondary | ICD-10-CM | POA: Diagnosis not present

## 2018-05-22 DIAGNOSIS — Z0001 Encounter for general adult medical examination with abnormal findings: Secondary | ICD-10-CM | POA: Diagnosis not present

## 2018-05-22 DIAGNOSIS — M810 Age-related osteoporosis without current pathological fracture: Secondary | ICD-10-CM | POA: Diagnosis not present

## 2018-05-22 DIAGNOSIS — C61 Malignant neoplasm of prostate: Secondary | ICD-10-CM | POA: Diagnosis not present

## 2018-05-22 DIAGNOSIS — E039 Hypothyroidism, unspecified: Secondary | ICD-10-CM | POA: Diagnosis not present

## 2018-05-22 DIAGNOSIS — R011 Cardiac murmur, unspecified: Secondary | ICD-10-CM | POA: Diagnosis not present

## 2018-05-22 DIAGNOSIS — I10 Essential (primary) hypertension: Secondary | ICD-10-CM | POA: Diagnosis not present

## 2018-06-04 ENCOUNTER — Ambulatory Visit (INDEPENDENT_AMBULATORY_CARE_PROVIDER_SITE_OTHER): Payer: Medicare Other | Admitting: Urology

## 2018-06-04 DIAGNOSIS — N401 Enlarged prostate with lower urinary tract symptoms: Secondary | ICD-10-CM

## 2018-06-04 DIAGNOSIS — R351 Nocturia: Secondary | ICD-10-CM | POA: Diagnosis not present

## 2018-06-04 DIAGNOSIS — R9721 Rising PSA following treatment for malignant neoplasm of prostate: Secondary | ICD-10-CM

## 2018-06-04 DIAGNOSIS — C61 Malignant neoplasm of prostate: Secondary | ICD-10-CM

## 2018-07-08 DIAGNOSIS — E039 Hypothyroidism, unspecified: Secondary | ICD-10-CM | POA: Diagnosis not present

## 2018-08-21 DIAGNOSIS — Z23 Encounter for immunization: Secondary | ICD-10-CM | POA: Diagnosis not present

## 2018-08-21 DIAGNOSIS — E039 Hypothyroidism, unspecified: Secondary | ICD-10-CM | POA: Diagnosis not present

## 2018-08-28 ENCOUNTER — Other Ambulatory Visit (HOSPITAL_COMMUNITY)
Admission: AD | Admit: 2018-08-28 | Discharge: 2018-08-28 | Disposition: A | Discharge status: Home / Self Care | Payer: Medicare Other | Source: Skilled Nursing Facility | Attending: Urology | Admitting: Urology

## 2018-08-28 DIAGNOSIS — R3914 Feeling of incomplete bladder emptying: Secondary | ICD-10-CM | POA: Diagnosis not present

## 2018-08-29 DIAGNOSIS — Z6821 Body mass index (BMI) 21.0-21.9, adult: Secondary | ICD-10-CM | POA: Diagnosis not present

## 2018-08-29 DIAGNOSIS — I890 Lymphedema, not elsewhere classified: Secondary | ICD-10-CM | POA: Diagnosis not present

## 2018-08-30 LAB — URINE CULTURE: Culture: <10000 — AB

## 2018-10-15 ENCOUNTER — Ambulatory Visit (INDEPENDENT_AMBULATORY_CARE_PROVIDER_SITE_OTHER): Payer: Medicare Other | Admitting: Urology

## 2018-10-15 DIAGNOSIS — M818 Other osteoporosis without current pathological fracture: Secondary | ICD-10-CM

## 2018-10-15 DIAGNOSIS — C61 Malignant neoplasm of prostate: Secondary | ICD-10-CM

## 2018-10-15 DIAGNOSIS — R9721 Rising PSA following treatment for malignant neoplasm of prostate: Secondary | ICD-10-CM

## 2018-10-15 DIAGNOSIS — Z79899 Other long term (current) drug therapy: Secondary | ICD-10-CM

## 2018-11-22 DIAGNOSIS — E039 Hypothyroidism, unspecified: Secondary | ICD-10-CM | POA: Diagnosis not present

## 2018-11-22 DIAGNOSIS — N183 Chronic kidney disease, stage 3 (moderate): Secondary | ICD-10-CM | POA: Diagnosis not present

## 2018-11-22 DIAGNOSIS — R634 Abnormal weight loss: Secondary | ICD-10-CM | POA: Diagnosis not present

## 2018-11-22 DIAGNOSIS — I10 Essential (primary) hypertension: Secondary | ICD-10-CM | POA: Diagnosis not present

## 2018-11-22 DIAGNOSIS — R42 Dizziness and giddiness: Secondary | ICD-10-CM | POA: Diagnosis not present

## 2018-11-26 DIAGNOSIS — Z6821 Body mass index (BMI) 21.0-21.9, adult: Secondary | ICD-10-CM | POA: Diagnosis not present

## 2018-11-26 DIAGNOSIS — N183 Chronic kidney disease, stage 3 (moderate): Secondary | ICD-10-CM | POA: Diagnosis not present

## 2018-11-26 DIAGNOSIS — R011 Cardiac murmur, unspecified: Secondary | ICD-10-CM | POA: Diagnosis not present

## 2018-11-26 DIAGNOSIS — R5383 Other fatigue: Secondary | ICD-10-CM | POA: Diagnosis not present

## 2018-11-26 DIAGNOSIS — I10 Essential (primary) hypertension: Secondary | ICD-10-CM | POA: Diagnosis not present

## 2018-11-26 DIAGNOSIS — R42 Dizziness and giddiness: Secondary | ICD-10-CM | POA: Diagnosis not present

## 2018-11-26 DIAGNOSIS — C61 Malignant neoplasm of prostate: Secondary | ICD-10-CM | POA: Diagnosis not present

## 2018-11-26 DIAGNOSIS — I890 Lymphedema, not elsewhere classified: Secondary | ICD-10-CM | POA: Diagnosis not present

## 2019-01-03 DIAGNOSIS — S91339A Puncture wound without foreign body, unspecified foot, initial encounter: Secondary | ICD-10-CM | POA: Diagnosis not present

## 2019-01-03 DIAGNOSIS — Z23 Encounter for immunization: Secondary | ICD-10-CM | POA: Diagnosis not present

## 2019-02-08 DIAGNOSIS — R300 Dysuria: Secondary | ICD-10-CM | POA: Diagnosis not present

## 2019-02-18 ENCOUNTER — Ambulatory Visit: Payer: Medicare Other | Admitting: Urology

## 2019-03-10 ENCOUNTER — Inpatient Hospital Stay (HOSPITAL_COMMUNITY)
Admission: EM | Admit: 2019-03-10 | Discharge: 2019-04-09 | DRG: 871 | Disposition: E | Payer: Medicare Other | Attending: Family Medicine | Admitting: Family Medicine

## 2019-03-10 ENCOUNTER — Emergency Department (HOSPITAL_COMMUNITY): Payer: Medicare Other

## 2019-03-10 ENCOUNTER — Other Ambulatory Visit: Payer: Self-pay

## 2019-03-10 DIAGNOSIS — J9601 Acute respiratory failure with hypoxia: Secondary | ICD-10-CM | POA: Diagnosis present

## 2019-03-10 DIAGNOSIS — Z79890 Hormone replacement therapy (postmenopausal): Secondary | ICD-10-CM

## 2019-03-10 DIAGNOSIS — N179 Acute kidney failure, unspecified: Secondary | ICD-10-CM | POA: Diagnosis present

## 2019-03-10 DIAGNOSIS — Z515 Encounter for palliative care: Secondary | ICD-10-CM

## 2019-03-10 DIAGNOSIS — Z66 Do not resuscitate: Secondary | ICD-10-CM | POA: Diagnosis not present

## 2019-03-10 DIAGNOSIS — Z9911 Dependence on respirator [ventilator] status: Secondary | ICD-10-CM

## 2019-03-10 DIAGNOSIS — R6521 Severe sepsis with septic shock: Secondary | ICD-10-CM | POA: Diagnosis present

## 2019-03-10 DIAGNOSIS — M810 Age-related osteoporosis without current pathological fracture: Secondary | ICD-10-CM | POA: Diagnosis present

## 2019-03-10 DIAGNOSIS — I959 Hypotension, unspecified: Secondary | ICD-10-CM | POA: Diagnosis present

## 2019-03-10 DIAGNOSIS — N390 Urinary tract infection, site not specified: Secondary | ICD-10-CM | POA: Diagnosis present

## 2019-03-10 DIAGNOSIS — Z7189 Other specified counseling: Secondary | ICD-10-CM

## 2019-03-10 DIAGNOSIS — R000 Tachycardia, unspecified: Secondary | ICD-10-CM | POA: Diagnosis not present

## 2019-03-10 DIAGNOSIS — J810 Acute pulmonary edema: Secondary | ICD-10-CM | POA: Diagnosis present

## 2019-03-10 DIAGNOSIS — R1900 Intra-abdominal and pelvic swelling, mass and lump, unspecified site: Secondary | ICD-10-CM | POA: Diagnosis not present

## 2019-03-10 DIAGNOSIS — J90 Pleural effusion, not elsewhere classified: Secondary | ICD-10-CM | POA: Diagnosis not present

## 2019-03-10 DIAGNOSIS — A419 Sepsis, unspecified organism: Secondary | ICD-10-CM

## 2019-03-10 DIAGNOSIS — A4150 Gram-negative sepsis, unspecified: Secondary | ICD-10-CM | POA: Diagnosis not present

## 2019-03-10 DIAGNOSIS — R627 Adult failure to thrive: Secondary | ICD-10-CM | POA: Diagnosis present

## 2019-03-10 DIAGNOSIS — J449 Chronic obstructive pulmonary disease, unspecified: Secondary | ICD-10-CM | POA: Diagnosis present

## 2019-03-10 DIAGNOSIS — R0689 Other abnormalities of breathing: Secondary | ICD-10-CM | POA: Diagnosis not present

## 2019-03-10 DIAGNOSIS — E46 Unspecified protein-calorie malnutrition: Secondary | ICD-10-CM | POA: Diagnosis present

## 2019-03-10 DIAGNOSIS — J9690 Respiratory failure, unspecified, unspecified whether with hypoxia or hypercapnia: Secondary | ICD-10-CM

## 2019-03-10 DIAGNOSIS — J189 Pneumonia, unspecified organism: Secondary | ICD-10-CM | POA: Diagnosis present

## 2019-03-10 DIAGNOSIS — Z6822 Body mass index (BMI) 22.0-22.9, adult: Secondary | ICD-10-CM

## 2019-03-10 DIAGNOSIS — C7951 Secondary malignant neoplasm of bone: Secondary | ICD-10-CM | POA: Diagnosis present

## 2019-03-10 DIAGNOSIS — Z20828 Contact with and (suspected) exposure to other viral communicable diseases: Secondary | ICD-10-CM | POA: Diagnosis not present

## 2019-03-10 DIAGNOSIS — Z79899 Other long term (current) drug therapy: Secondary | ICD-10-CM

## 2019-03-10 DIAGNOSIS — I10 Essential (primary) hypertension: Secondary | ICD-10-CM | POA: Diagnosis present

## 2019-03-10 DIAGNOSIS — Z452 Encounter for adjustment and management of vascular access device: Secondary | ICD-10-CM | POA: Diagnosis not present

## 2019-03-10 DIAGNOSIS — Z8546 Personal history of malignant neoplasm of prostate: Secondary | ICD-10-CM

## 2019-03-10 DIAGNOSIS — F17290 Nicotine dependence, other tobacco product, uncomplicated: Secondary | ICD-10-CM | POA: Diagnosis present

## 2019-03-10 DIAGNOSIS — Z7983 Long term (current) use of bisphosphonates: Secondary | ICD-10-CM

## 2019-03-10 DIAGNOSIS — R4182 Altered mental status, unspecified: Secondary | ICD-10-CM | POA: Diagnosis not present

## 2019-03-10 DIAGNOSIS — R918 Other nonspecific abnormal finding of lung field: Secondary | ICD-10-CM | POA: Diagnosis not present

## 2019-03-10 DIAGNOSIS — R0902 Hypoxemia: Secondary | ICD-10-CM | POA: Diagnosis not present

## 2019-03-10 DIAGNOSIS — H9190 Unspecified hearing loss, unspecified ear: Secondary | ICD-10-CM | POA: Diagnosis present

## 2019-03-10 DIAGNOSIS — E039 Hypothyroidism, unspecified: Secondary | ICD-10-CM | POA: Diagnosis present

## 2019-03-10 LAB — BLOOD GAS, ARTERIAL
Acid-base deficit: 10.7 mmol/L — ABNORMAL HIGH (ref 0.0–2.0)
Bicarbonate: 15.8 mmol/L — ABNORMAL LOW (ref 20.0–28.0)
Drawn by: 41977
FIO2: 100
MECHVT: 540 mL
O2 Saturation: 98.7 %
PEEP: 5 cmH2O
Patient temperature: 36.9
RATE: 18 resp/min
pCO2 arterial: 35.1 mmHg (ref 32.0–48.0)
pH, Arterial: 7.254 — ABNORMAL LOW (ref 7.350–7.450)
pO2, Arterial: 230 mmHg — ABNORMAL HIGH (ref 83.0–108.0)

## 2019-03-10 MED ORDER — SODIUM CHLORIDE 0.9 % IV BOLUS
1000.0000 mL | Freq: Once | INTRAVENOUS | Status: AC
  Administered 2019-03-10: 1000 mL via INTRAVENOUS

## 2019-03-10 MED ORDER — EPINEPHRINE 1 MG/10ML IJ SOSY
0.1000 mg | PREFILLED_SYRINGE | Freq: Once | INTRAMUSCULAR | Status: AC
  Administered 2019-03-10: 0.1 mg via INTRAVENOUS

## 2019-03-10 MED ORDER — SODIUM BICARBONATE 8.4 % IV SOLN
50.0000 meq | Freq: Once | INTRAVENOUS | Status: AC
  Administered 2019-03-10: 50 meq via INTRAVENOUS

## 2019-03-10 MED ORDER — ROCURONIUM BROMIDE 50 MG/5ML IV SOLN
80.0000 mg | Freq: Once | INTRAVENOUS | Status: AC
  Administered 2019-03-10: 80 mg via INTRAVENOUS
  Filled 2019-03-10: qty 8

## 2019-03-10 MED ORDER — MIDAZOLAM HCL 5 MG/5ML IJ SOLN
0.5000 mg | INTRAMUSCULAR | Status: DC | PRN
  Administered 2019-03-10: 0.5 mg via INTRAVENOUS
  Filled 2019-03-10: qty 5

## 2019-03-10 MED ORDER — NOREPINEPHRINE 4 MG/250ML-% IV SOLN
0.0000 ug/min | INTRAVENOUS | Status: DC
  Administered 2019-03-10: 2 ug/min via INTRAVENOUS
  Administered 2019-03-11 (×2): 40 ug/min via INTRAVENOUS
  Administered 2019-03-11: 30 ug/min via INTRAVENOUS
  Administered 2019-03-11: 04:00:00 17.013 ug/min via INTRAVENOUS
  Filled 2019-03-10 (×4): qty 250

## 2019-03-10 MED ORDER — SODIUM CHLORIDE 0.9 % IV SOLN
2.0000 g | Freq: Once | INTRAVENOUS | Status: AC
  Administered 2019-03-10: 2 g via INTRAVENOUS
  Filled 2019-03-10: qty 2

## 2019-03-10 MED ORDER — ETOMIDATE 2 MG/ML IV SOLN
10.0000 mg | Freq: Once | INTRAVENOUS | Status: AC
  Administered 2019-03-10: 10 mg via INTRAVENOUS

## 2019-03-10 MED ORDER — VANCOMYCIN HCL IN DEXTROSE 1-5 GM/200ML-% IV SOLN
1000.0000 mg | Freq: Once | INTRAVENOUS | Status: AC
  Administered 2019-03-10: 1000 mg via INTRAVENOUS
  Filled 2019-03-10: qty 200

## 2019-03-10 NOTE — ED Provider Notes (Addendum)
[facility] Emergency Department Provider Note MRN:  [id number]  Arrival date & time: 2019-03-23     Chief Complaint   Shortness of Breath   History of Present Illness   Bryan Ho is a 85 y.o. year-old male with a history of prostate cancer presenting to the ED with chief complaint of shortness of breath.  2 days of general malaise, failure to thrive at home, not eating or drinking.  Confusion at home.  Report of a recent UTI.  I was unable to obtain an accurate HPI, PMH, or ROS due to the patient's altered mental status.  Review of Systems  Positive for altered mental status, weakness, malaise.  Patient's Health History    Past Medical History:  Diagnosis Date  . Bone metastases ([place])   . Cardiac murmur   . Dizziness and giddiness   . History of adenocarcinoma of prostate 2001  . Hypertension   . Osteoporosis     Past Surgical History:  Procedure Laterality Date  . BIOPSY PROSTATE     20 years ago, cancer bx    No family history on file.  Social History   Socioeconomic History  . Marital status: Married    Spouse name: Not on file  . Number of children: Not on file  . Years of education: Not on file  . Highest education level: Not on file  Occupational History  . Not on file  Social Needs  . Financial resource strain: Not on file  . Food insecurity:    Worry: Not on file    Inability: Not on file  . Transportation needs:    Medical: Not on file    Non-medical: Not on file  Tobacco Use  . Smoking status: Former Smoker    Packs/day: 3.00    Years: 40.00    Pack years: 120.00    Types: Cigarettes    Start date: 06/12/1937    Last attempt to quit: 06/12/1977    Years since quitting: 41.7  . Smokeless tobacco: Current User    Types: Snuff  . Tobacco comment: starting dipping snuff at age 51   Substance and Sexual Activity  . Alcohol use: Not on file  . Drug use: Not on file  . Sexual activity: Not on file  Lifestyle  . Physical  activity:    Days per week: Not on file    Minutes per session: Not on file  . Stress: Not on file  Relationships  . Social connections:    Talks on phone: Not on file    Gets together: Not on file    Attends religious service: Not on file    Active member of club or organization: Not on file    Attends meetings of clubs or organizations: Not on file    Relationship status: Not on file  . Intimate partner violence:    Fear of current or ex partner: Not on file    Emotionally abused: Not on file    Physically abused: Not on file    Forced sexual activity: Not on file  Other Topics Concern  . Not on file  Social History Narrative  . Not on file     Physical Exam  Vital Signs and Nursing Notes reviewed Vitals:   04/02/2019 2315 04/03/2019 2330  BP:  (!) 90/44  Pulse: (!) 117 (!) 116  Resp: 18 18  Temp: 98.6 F (37 C) 98.2 F (36.8 C)  SpO2: 100% 100%  CONSTITUTIONAL: Ill-appearing, in severe respiratory distress NEURO: Somnolent, minimal response to voice EYES:  eyes equal and reactive ENT/NECK:  no LAD, no JVD CARDIO: Tachycardic rate, poorly perfused PULM: Diffuse scattered rhonchi GI/GU:  normal bowel sounds, non-distended, non-tender MSK/SPINE:  No gross deformities, no edema SKIN:  no rash, atraumatic PSYCH:  Appropriate speech and behavior  Diagnostic and Interventional Summary    EKG Interpretation  Date/Time:  Monday March 10 2019 22:08:19 EDT Ventricular Rate:  117 PR Interval:    QRS Duration: 91 QT Interval:  324 QTC Calculation: 452 R Axis:   44 Text Interpretation:  Sinus tachycardia Atrial premature complex Consider right atrial enlargement Borderline low voltage, extremity leads Confirmed by Gerlene Fee [phone]) on 03/19/2019 11:18:01 PM      Labs Reviewed  BLOOD GAS, ARTERIAL - Abnormal; Notable for the following components:      Result Value   pH, Arterial 7.254 (*)    pO2, Arterial 230 (*)    Bicarbonate 15.8 (*)    Acid-base deficit 10.7  (*)    All other components within normal limits  CULTURE, BLOOD (ROUTINE X 2)  CULTURE, BLOOD (ROUTINE X 2)  SARS CORONAVIRUS 2 (HOSPITAL ORDER, [place] [facility])  LACTIC ACID, PLASMA  LACTIC ACID, PLASMA  COMPREHENSIVE METABOLIC PANEL  CBC WITH DIFFERENTIAL/PLATELET  URINALYSIS, ROUTINE W REFLEX MICROSCOPIC  BRAIN NATRIURETIC PEPTIDE    DG Chest Port 1 View  Final Result      Medications  norepinephrine (LEVOPHED) 4mg  in 266mL premix infusion (12 mcg/min Intravenous Rate/Dose Change 03/22/2019 2235)  midazolam (VERSED) 5 MG/5ML injection 0.5 mg (0.5 mg Intravenous Given 03/24/2019 2357)  vancomycin (VANCOCIN) IVPB 1000 mg/200 mL premix (0 mg Intravenous Stopped 03/15/2019 2355)  ceFEPIme (MAXIPIME) 2 g in sodium chloride 0.9 % 100 mL IVPB (0 g Intravenous Stopped 03/18/2019 2300)  sodium chloride 0.9 % bolus 1,000 mL (1,000 mLs Intravenous New Bag/Given 03/19/2019 2220)  sodium chloride 0.9 % bolus 1,000 mL (0 mLs Intravenous Stopped 03/12/2019 2312)  sodium chloride 0.9 % bolus 1,000 mL (0 mLs Intravenous Stopped 03/15/2019 2356)  etomidate (AMIDATE) injection 10 mg (10 mg Intravenous Given 03/22/2019 2232)  rocuronium (ZEMURON) injection 80 mg (80 mg Intravenous Given 03/21/2019 2233)  EPINEPHrine (ADRENALIN) 1 MG/10ML injection 0.1 mg (0.1 mg Intravenous Given 04/03/2019 2359)  EPINEPHrine (ADRENALIN) 1 MG/10ML injection 0.1 mg (0.1 mg Intravenous Given 04/08/2019 2204)  sodium bicarbonate injection 50 mEq (50 mEq Intravenous Given 04/07/2019 2216)     Procedure Name: Intubation Date/Time: 04/06/2019 11:21 PM Performed by: Maudie Flakes, MD Pre-anesthesia Checklist: Patient identified, Patient being monitored, Emergency Drugs available, Timeout performed and Suction available Oxygen Delivery Method: Non-rebreather mask Preoxygenation: Pre-oxygenation with 100% oxygen Induction Type: Rapid sequence Ventilation: Mask ventilation without difficulty Laryngoscope Size: Glidescope and 3 Grade View: Grade  II Tube size: 7.5 mm Number of attempts: 1 Airway Equipment and Method: Rigid stylet Placement Confirmation: ETT inserted through vocal cords under direct vision,  CO2 detector and Breath sounds checked- equal and bilateral Secured at: 22 cm Tube secured with: ETT holder Comments: Uncomplicated RSI intubation performed under emergent conditions using 10 mg etomidate, 80 mg rocuronium.     Linus Salmons Line Date/Time: 26-Mar-2019 1:23 AM Performed by: Maudie Flakes, MD Authorized by: Maudie Flakes, MD   Consent:    Consent obtained:  Emergent situation Pre-procedure details:    Hand hygiene: Hand hygiene performed prior to insertion     Sterile barrier technique: All elements of maximal  sterile technique followed     Skin preparation:  2% chlorhexidine   Skin preparation agent: Skin preparation agent completely dried prior to procedure   Sedation:    Sedation type:  Anxiolysis Anesthesia (see MAR for exact dosages):    Anesthesia method:  None Procedure details:    Location:  R internal jugular   Patient position:  Trendelenburg   Procedural supplies:  Triple lumen   Landmarks identified: yes     Ultrasound guidance: yes     Sterile ultrasound techniques: Sterile gel and sterile probe covers were used     Number of attempts:  1   Successful placement: yes   Post-procedure details:    Post-procedure:  Dressing applied and line sutured   Assessment:  Blood return through all ports, no pneumothorax on x-ray, placement verified by x-ray and free fluid flow   Patient tolerance of procedure:  Tolerated well, no immediate complications     EMERGENCY DEPARTMENT [place] CARDIAC EXAM "Study: Limited Ultrasound of the Heart and Pericardium"  INDICATIONS:Abnormal vital signs and Dyspnea Multiple views of the heart and pericardium were obtained in real-time with a multi-frequency probe.  PERFORMED AT:[facility] IMAGES ARCHIVED?: No LIMITATIONS:  None VIEWS USED: Apical 4 chamber   INTERPRETATION: Cardiac activity present, Pericardial effusioin absent, Increased contractility and Hyperdynamic heart, no evidence of dilated right ventricle    Critical Care Critical Care Documentation Critical care time provided by me (excluding procedures): 60 minutes  Condition necessitating critical care: Septic shock  Components of critical care management: reviewing of prior records, laboratory and imaging interpretation, frequent re-examination and reassessment of vital signs, administration of IV fluids, IV pressors, IV antibiotics, ventilatory management discussion with consulting services    ED Course and Medical Decision Making  I have reviewed the triage vital signs and the nursing notes.  Pertinent labs & imaging results that were available during my care of the patient were reviewed by me and considered in my medical decision making (see below for details).  Concern for urosepsis with septic shock in this 85 year old male.  Patient arrived on nonrebreather, altered, 93% despite nonrebreather.I had a full discussion with family, who explained that patient is normally very conversant, active, otherwise fairly healthy other than a remote history of prostate cancer.  Patient has never refused the idea of intubation, family welcoming of intubation and ventilator management in the acute setting.  Patient then became profoundly hypotensive with a systolic of 60 despite IV fluids requiring push dose pressor use, specifically 0.1 mg of code epinephrine.  This dose given twice well fluids being pressure bag.  Norepinephrine drip then started.  Patient's mental status improved with the improved blood pressure of 90 systolic.  Was still breathing 30-40 times per minute, still requiring nonrebreather, so patient was hemodynamically optimized and intubation was performed as described above.  Plan for intensivist admission.  Accepted by intensivist service for admission here at [facility] requesting central line given patient's norepinephrine requirement, performed as described above.  Barth Kirks. Sedonia Small, [place] [email or web address]  Final Clinical Impressions(s) / ED Diagnoses     ICD-10-CM   1. Acute respiratory failure with hypoxia (HCC) J96.01   2. Septic shock [facility]) A41.9    R65.21     ED Discharge Orders    None         Maudie Flakes, MD 03-21-19 0003    Maudie Flakes, MD Mar 21, 2019 [phone]

## 2019-03-10 NOTE — ED Triage Notes (Signed)
Pt brought in by [facility] for c/o sob; pt was recently on abx for a uti and family was told by medical provider to stop early; family reported to ems that pt has not been feeling well the last few days and today became more sob; pt has been coughing up pink frothy sputum;

## 2019-03-11 ENCOUNTER — Emergency Department (HOSPITAL_COMMUNITY): Payer: Medicare Other

## 2019-03-11 ENCOUNTER — Encounter (HOSPITAL_COMMUNITY): Payer: Self-pay | Admitting: *Deleted

## 2019-03-11 ENCOUNTER — Inpatient Hospital Stay (HOSPITAL_COMMUNITY): Payer: Medicare Other

## 2019-03-11 ENCOUNTER — Ambulatory Visit: Payer: Medicare Other | Admitting: Urology

## 2019-03-11 ENCOUNTER — Other Ambulatory Visit: Payer: Self-pay

## 2019-03-11 DIAGNOSIS — Z515 Encounter for palliative care: Secondary | ICD-10-CM

## 2019-03-11 DIAGNOSIS — R6521 Severe sepsis with septic shock: Secondary | ICD-10-CM | POA: Diagnosis present

## 2019-03-11 DIAGNOSIS — M810 Age-related osteoporosis without current pathological fracture: Secondary | ICD-10-CM | POA: Diagnosis present

## 2019-03-11 DIAGNOSIS — J810 Acute pulmonary edema: Secondary | ICD-10-CM | POA: Diagnosis present

## 2019-03-11 DIAGNOSIS — Z8546 Personal history of malignant neoplasm of prostate: Secondary | ICD-10-CM | POA: Diagnosis not present

## 2019-03-11 DIAGNOSIS — J9690 Respiratory failure, unspecified, unspecified whether with hypoxia or hypercapnia: Secondary | ICD-10-CM | POA: Diagnosis not present

## 2019-03-11 DIAGNOSIS — F17290 Nicotine dependence, other tobacco product, uncomplicated: Secondary | ICD-10-CM | POA: Diagnosis present

## 2019-03-11 DIAGNOSIS — A4150 Gram-negative sepsis, unspecified: Secondary | ICD-10-CM | POA: Diagnosis not present

## 2019-03-11 DIAGNOSIS — N179 Acute kidney failure, unspecified: Secondary | ICD-10-CM | POA: Diagnosis not present

## 2019-03-11 DIAGNOSIS — Z7189 Other specified counseling: Secondary | ICD-10-CM

## 2019-03-11 DIAGNOSIS — J9601 Acute respiratory failure with hypoxia: Secondary | ICD-10-CM | POA: Diagnosis present

## 2019-03-11 DIAGNOSIS — Z79899 Other long term (current) drug therapy: Secondary | ICD-10-CM | POA: Diagnosis not present

## 2019-03-11 DIAGNOSIS — R627 Adult failure to thrive: Secondary | ICD-10-CM | POA: Diagnosis present

## 2019-03-11 DIAGNOSIS — Z9911 Dependence on respirator [ventilator] status: Secondary | ICD-10-CM

## 2019-03-11 DIAGNOSIS — J189 Pneumonia, unspecified organism: Secondary | ICD-10-CM | POA: Diagnosis present

## 2019-03-11 DIAGNOSIS — A419 Sepsis, unspecified organism: Secondary | ICD-10-CM

## 2019-03-11 DIAGNOSIS — Z7983 Long term (current) use of bisphosphonates: Secondary | ICD-10-CM | POA: Diagnosis not present

## 2019-03-11 DIAGNOSIS — Z20828 Contact with and (suspected) exposure to other viral communicable diseases: Secondary | ICD-10-CM | POA: Diagnosis present

## 2019-03-11 DIAGNOSIS — H9190 Unspecified hearing loss, unspecified ear: Secondary | ICD-10-CM | POA: Diagnosis present

## 2019-03-11 DIAGNOSIS — Z79890 Hormone replacement therapy (postmenopausal): Secondary | ICD-10-CM | POA: Diagnosis not present

## 2019-03-11 DIAGNOSIS — J449 Chronic obstructive pulmonary disease, unspecified: Secondary | ICD-10-CM | POA: Diagnosis present

## 2019-03-11 DIAGNOSIS — I959 Hypotension, unspecified: Secondary | ICD-10-CM | POA: Diagnosis not present

## 2019-03-11 DIAGNOSIS — C7951 Secondary malignant neoplasm of bone: Secondary | ICD-10-CM | POA: Diagnosis present

## 2019-03-11 DIAGNOSIS — N390 Urinary tract infection, site not specified: Secondary | ICD-10-CM | POA: Diagnosis present

## 2019-03-11 DIAGNOSIS — J90 Pleural effusion, not elsewhere classified: Secondary | ICD-10-CM | POA: Diagnosis not present

## 2019-03-11 DIAGNOSIS — R6520 Severe sepsis without septic shock: Secondary | ICD-10-CM | POA: Diagnosis not present

## 2019-03-11 DIAGNOSIS — E46 Unspecified protein-calorie malnutrition: Secondary | ICD-10-CM | POA: Diagnosis present

## 2019-03-11 DIAGNOSIS — I10 Essential (primary) hypertension: Secondary | ICD-10-CM | POA: Diagnosis present

## 2019-03-11 DIAGNOSIS — Z452 Encounter for adjustment and management of vascular access device: Secondary | ICD-10-CM | POA: Diagnosis not present

## 2019-03-11 DIAGNOSIS — R918 Other nonspecific abnormal finding of lung field: Secondary | ICD-10-CM | POA: Diagnosis not present

## 2019-03-11 DIAGNOSIS — Z66 Do not resuscitate: Secondary | ICD-10-CM | POA: Diagnosis not present

## 2019-03-11 DIAGNOSIS — Z6822 Body mass index (BMI) 22.0-22.9, adult: Secondary | ICD-10-CM | POA: Diagnosis not present

## 2019-03-11 DIAGNOSIS — E039 Hypothyroidism, unspecified: Secondary | ICD-10-CM | POA: Diagnosis present

## 2019-03-11 LAB — CBC WITH DIFFERENTIAL/PLATELET
Abs Immature Granulocytes: 0.23 10*3/uL — ABNORMAL HIGH (ref 0.00–0.07)
Abs Immature Granulocytes: 2.03 10*3/uL — ABNORMAL HIGH (ref 0.00–0.07)
Basophils Absolute: 0 10*3/uL (ref 0.0–0.1)
Basophils Absolute: 0.1 10*3/uL (ref 0.0–0.1)
Basophils Relative: 0 %
Basophils Relative: 0 %
Eosinophils Absolute: 0 10*3/uL (ref 0.0–0.5)
Eosinophils Absolute: 0.2 10*3/uL (ref 0.0–0.5)
Eosinophils Relative: 0 %
Eosinophils Relative: 1 %
HCT: 29.7 % — ABNORMAL LOW (ref 39.0–52.0)
HCT: 31.7 % — ABNORMAL LOW (ref 39.0–52.0)
Hemoglobin: 10 g/dL — ABNORMAL LOW (ref 13.0–17.0)
Hemoglobin: 9.9 g/dL — ABNORMAL LOW (ref 13.0–17.0)
Immature Granulocytes: 3 %
Immature Granulocytes: 5 %
Lymphocytes Relative: 1 %
Lymphocytes Relative: 2 %
Lymphs Abs: 0.2 10*3/uL — ABNORMAL LOW (ref 0.7–4.0)
Lymphs Abs: 0.3 10*3/uL — ABNORMAL LOW (ref 0.7–4.0)
MCH: 31.7 pg (ref 26.0–34.0)
MCH: 33.1 pg (ref 26.0–34.0)
MCHC: 31.5 g/dL (ref 30.0–36.0)
MCHC: 33.3 g/dL (ref 30.0–36.0)
MCV: 100.6 fL — ABNORMAL HIGH (ref 80.0–100.0)
MCV: 99.3 fL (ref 80.0–100.0)
Monocytes Absolute: 0.1 10*3/uL (ref 0.1–1.0)
Monocytes Absolute: 1.3 10*3/uL — ABNORMAL HIGH (ref 0.1–1.0)
Monocytes Relative: 1 %
Monocytes Relative: 3 %
Neutro Abs: 37.7 10*3/uL — ABNORMAL HIGH (ref 1.7–7.7)
Neutro Abs: 8.8 10*3/uL — ABNORMAL HIGH (ref 1.7–7.7)
Neutrophils Relative %: 90 %
Neutrophils Relative %: 94 %
Platelets: 170 10*3/uL (ref 150–400)
Platelets: 186 10*3/uL (ref 150–400)
RBC: 2.99 MIL/uL — ABNORMAL LOW (ref 4.22–5.81)
RBC: 3.15 MIL/uL — ABNORMAL LOW (ref 4.22–5.81)
RDW: 12.7 % (ref 11.5–15.5)
RDW: 13 % (ref 11.5–15.5)
WBC: 41.7 10*3/uL — ABNORMAL HIGH (ref 4.0–10.5)
WBC: 9.2 10*3/uL (ref 4.0–10.5)
nRBC: 0 % (ref 0.0–0.2)
nRBC: 0 % (ref 0.0–0.2)

## 2019-03-11 LAB — COMPREHENSIVE METABOLIC PANEL
ALT: 26 U/L (ref 0–44)
ALT: 31 U/L (ref 0–44)
AST: 40 U/L (ref 15–41)
AST: 51 U/L — ABNORMAL HIGH (ref 15–41)
Albumin: 1.9 g/dL — ABNORMAL LOW (ref 3.5–5.0)
Albumin: 1.5 g/dL — ABNORMAL LOW (ref 3.5–5.0)
Alkaline Phosphatase: 381 U/L — ABNORMAL HIGH (ref 38–126)
Alkaline Phosphatase: 205 U/L — ABNORMAL HIGH (ref 38–126)
Anion gap: 15 (ref 5–15)
Anion gap: 13 (ref 5–15)
BUN: 68 mg/dL — ABNORMAL HIGH (ref 8–23)
BUN: 64 mg/dL — ABNORMAL HIGH (ref 8–23)
CO2: 15 mmol/L — ABNORMAL LOW (ref 22–32)
CO2: 13 mmol/L — ABNORMAL LOW (ref 22–32)
Calcium: 7 mg/dL — ABNORMAL LOW (ref 8.9–10.3)
Calcium: 6.1 mg/dL — CL (ref 8.9–10.3)
Chloride: 101 mmol/L (ref 98–111)
Chloride: 106 mmol/L (ref 98–111)
Creatinine, Ser: 3.63 mg/dL — ABNORMAL HIGH (ref 0.61–1.24)
Creatinine, Ser: 3.57 mg/dL — ABNORMAL HIGH (ref 0.61–1.24)
GFR calc Af Amer: 16 mL/min — ABNORMAL LOW (ref >60)
GFR calc Af Amer: 16 mL/min — ABNORMAL LOW (ref >60)
GFR calc non Af Amer: 14 mL/min — ABNORMAL LOW (ref >60)
GFR calc non Af Amer: 14 mL/min — ABNORMAL LOW (ref >60)
Glucose, Bld: 112 mg/dL — ABNORMAL HIGH (ref 70–99)
Glucose, Bld: 79 mg/dL (ref 70–99)
Potassium: 3.9 mmol/L (ref 3.5–5.1)
Potassium: 3.3 mmol/L — ABNORMAL LOW (ref 3.5–5.1)
Sodium: 131 mmol/L — ABNORMAL LOW (ref 135–145)
Sodium: 132 mmol/L — ABNORMAL LOW (ref 135–145)
Total Bilirubin: 0.7 mg/dL (ref 0.3–1.2)
Total Bilirubin: 0.4 mg/dL (ref 0.3–1.2)
Total Protein: 4.5 g/dL — ABNORMAL LOW (ref 6.5–8.1)
Total Protein: 4.2 g/dL — ABNORMAL LOW (ref 6.5–8.1)

## 2019-03-11 LAB — BLOOD CULTURE ID PANEL (REFLEXED)

## 2019-03-11 LAB — URINALYSIS, ROUTINE W REFLEX MICROSCOPIC
Bilirubin Urine: NEGATIVE
Glucose, UA: NEGATIVE mg/dL
Ketones, ur: NEGATIVE mg/dL
Nitrite: NEGATIVE
Protein, ur: NEGATIVE mg/dL
Specific Gravity, Urine: 1.012 (ref 1.005–1.030)
pH: 5 (ref 5.0–8.0)

## 2019-03-11 LAB — GLUCOSE, CAPILLARY
Glucose-Capillary: 130 mg/dL — ABNORMAL HIGH (ref 70–99)
Glucose-Capillary: 83 mg/dL (ref 70–99)
Glucose-Capillary: 87 mg/dL (ref 70–99)
Glucose-Capillary: 105 mg/dL — ABNORMAL HIGH (ref 70–99)
Glucose-Capillary: 103 mg/dL — ABNORMAL HIGH (ref 70–99)

## 2019-03-11 LAB — BLOOD GAS, ARTERIAL
Acid-base deficit: 10.4 mmol/L — ABNORMAL HIGH (ref 0.0–2.0)
Bicarbonate: 16.2 mmol/L — ABNORMAL LOW (ref 20.0–28.0)
FIO2: 60
O2 Saturation: 96.6 %
Patient temperature: 37.6
pCO2 arterial: 32.5 mmHg (ref 32.0–48.0)
pH, Arterial: 7.285 — ABNORMAL LOW (ref 7.350–7.450)
pO2, Arterial: 97 mmHg (ref 83.0–108.0)

## 2019-03-11 LAB — MRSA PCR SCREENING: MRSA by PCR: NEGATIVE

## 2019-03-11 LAB — BRAIN NATRIURETIC PEPTIDE: B Natriuretic Peptide: 361 pg/mL — ABNORMAL HIGH (ref 0.0–100.0)

## 2019-03-11 LAB — LACTIC ACID, PLASMA
Lactic Acid, Venous: 4.3 mmol/L (ref 0.5–1.9)
Lactic Acid, Venous: 4.9 mmol/L (ref 0.5–1.9)
Lactic Acid, Venous: 3.2 mmol/L (ref 0.5–1.9)

## 2019-03-11 LAB — SARS CORONAVIRUS 2 BY RT PCR (HOSPITAL ORDER, PERFORMED IN ~~LOC~~ HOSPITAL LAB): SARS Coronavirus 2: NEGATIVE

## 2019-03-11 MED ORDER — SODIUM CHLORIDE 0.9 % IV BOLUS
500.0000 mL | Freq: Once | INTRAVENOUS | Status: AC
  Administered 2019-03-11: 500 mL via INTRAVENOUS

## 2019-03-11 MED ORDER — ORAL CARE MOUTH RINSE
15.0000 mL | OROMUCOSAL | Status: DC
  Administered 2019-03-11 (×8): 15 mL via OROMUCOSAL

## 2019-03-11 MED ORDER — FAMOTIDINE IN NACL 20-0.9 MG/50ML-% IV SOLN
20.0000 mg | Freq: Two times a day (BID) | INTRAVENOUS | Status: DC
  Administered 2019-03-11: 20 mg via INTRAVENOUS
  Filled 2019-03-11: qty 50

## 2019-03-11 MED ORDER — SODIUM CHLORIDE 0.9 % IV SOLN
Freq: Once | INTRAVENOUS | Status: AC
  Administered 2019-03-11: 09:00:00 via INTRAVENOUS

## 2019-03-11 MED ORDER — CHLORHEXIDINE GLUCONATE CLOTH 2 % EX PADS
6.0000 | MEDICATED_PAD | Freq: Every day | CUTANEOUS | Status: DC
  Administered 2019-03-11: 6 via TOPICAL

## 2019-03-11 MED ORDER — SODIUM BICARBONATE 8.4 % IV SOLN
50.0000 meq | INTRAVENOUS | Status: AC
  Administered 2019-03-11 (×2): 50 meq via INTRAVENOUS
  Filled 2019-03-11 (×2): qty 50

## 2019-03-11 MED ORDER — VANCOMYCIN HCL IN DEXTROSE 1-5 GM/200ML-% IV SOLN: 1000.0000 mg | INTRAVENOUS | Status: DC

## 2019-03-11 MED ORDER — LEVOTHYROXINE SODIUM 100 MCG/5ML IV SOLN
12.5000 ug | Freq: Every day | INTRAVENOUS | Status: DC
  Administered 2019-03-11: 12.5 ug via INTRAVENOUS
  Filled 2019-03-11: qty 5

## 2019-03-11 MED ORDER — CHLORHEXIDINE GLUCONATE 0.12% ORAL RINSE (MEDLINE KIT)
15.0000 mL | Freq: Two times a day (BID) | OROMUCOSAL | Status: DC
  Administered 2019-03-11 (×2): 15 mL via OROMUCOSAL

## 2019-03-11 MED ORDER — SODIUM CHLORIDE 0.9 % IV SOLN
2.0000 g | INTRAVENOUS | Status: DC
  Administered 2019-03-11: 2 g via INTRAVENOUS
  Filled 2019-03-11: qty 20

## 2019-03-11 MED ORDER — POTASSIUM CHLORIDE 10 MEQ/100ML IV SOLN
10.0000 meq | INTRAVENOUS | Status: AC
  Administered 2019-03-11 (×4): 10 meq via INTRAVENOUS
  Filled 2019-03-11 (×4): qty 100

## 2019-03-11 MED ORDER — PHENYLEPHRINE HCL-NACL 10-0.9 MG/250ML-% IV SOLN
0.0000 ug/min | INTRAVENOUS | Status: DC
  Administered 2019-03-11: 20 ug/min via INTRAVENOUS
  Filled 2019-03-11: qty 250

## 2019-03-11 MED ORDER — FENTANYL CITRATE (PF) 100 MCG/2ML IJ SOLN
25.0000 ug | INTRAMUSCULAR | Status: DC | PRN
  Administered 2019-03-11: 25 ug via INTRAVENOUS
  Filled 2019-03-11: qty 2

## 2019-03-11 MED ORDER — PNEUMOCOCCAL VAC POLYVALENT 25 MCG/0.5ML IJ INJ: 0.5000 mL | INJECTION | INTRAMUSCULAR | Status: DC

## 2019-03-11 MED ORDER — SODIUM CHLORIDE 0.9 % IV BOLUS
1000.0000 mL | Freq: Once | INTRAVENOUS | Status: AC
  Administered 2019-03-11: 1000 mL via INTRAVENOUS

## 2019-03-11 MED ORDER — INSULIN ASPART 100 UNIT/ML ~~LOC~~ SOLN
0.0000 [IU] | SUBCUTANEOUS | Status: DC
  Administered 2019-03-11: 2 [IU] via SUBCUTANEOUS

## 2019-03-11 MED ORDER — SODIUM CHLORIDE 0.9 % IV SOLN
0.0000 ug/min | INTRAVENOUS | Status: DC
  Administered 2019-03-11: 400 ug/min via INTRAVENOUS
  Administered 2019-03-11: 290 ug/min via INTRAVENOUS
  Administered 2019-03-11: 400 ug/min via INTRAVENOUS
  Administered 2019-03-11: 350 ug/min via INTRAVENOUS
  Administered 2019-03-11: 21:00:00 400 ug/min via INTRAVENOUS
  Administered 2019-03-11 (×2): 70 ug/min via INTRAVENOUS
  Filled 2019-03-11 (×5): qty 4

## 2019-03-11 MED ORDER — SODIUM CHLORIDE 0.9 % IV SOLN: 1.0000 g | INTRAVENOUS | Status: DC

## 2019-03-11 MED ORDER — SODIUM CHLORIDE 0.9 % IV SOLN
500.0000 mg | Freq: Two times a day (BID) | INTRAVENOUS | Status: DC
  Filled 2019-03-11 (×3): qty 0.5

## 2019-03-11 MED ORDER — DOPAMINE-DEXTROSE 3.2-5 MG/ML-% IV SOLN
0.0000 ug/kg/min | INTRAVENOUS | Status: DC
  Administered 2019-03-11: 5 ug/kg/min via INTRAVENOUS
  Filled 2019-03-11: qty 250

## 2019-03-11 MED ORDER — ENOXAPARIN SODIUM 30 MG/0.3ML ~~LOC~~ SOLN
30.0000 mg | SUBCUTANEOUS | Status: DC
  Administered 2019-03-11: 30 mg via SUBCUTANEOUS
  Filled 2019-03-11: qty 0.3

## 2019-03-11 MED ORDER — DEXTROSE-NACL 5-0.9 % IV SOLN
INTRAVENOUS | Status: DC
  Administered 2019-03-11: 10:00:00 via INTRAVENOUS

## 2019-03-11 MED ORDER — NOREPINEPHRINE 16 MG/250ML-% IV SOLN
0.0000 ug/min | INTRAVENOUS | Status: DC
  Administered 2019-03-11 (×2): 40 ug/min via INTRAVENOUS
  Filled 2019-03-11 (×4): qty 250

## 2019-03-13 LAB — CULTURE, BLOOD (ROUTINE X 2)
Special Requests: ADEQUATE
Special Requests: ADEQUATE

## 2019-03-13 LAB — URINE CULTURE: Culture: >=100000 — AB

## 2019-03-17 MED FILL — Medication: Qty: 1 | Status: AC

## 2019-04-07 NOTE — Discharge Summary (Signed)
Bryan Ho YHC:[id number] DOB: 1922-11-17 DOA: 18-Mar-2019  PCP: Manon Hilding, MD PCP/Office notified: -  Admit date: 2019/03/18 Date of Death: 2019-03-19 at 2211-01-09 PM  Final Diagnoses:  Principal Problem:   Gram-negative sepsis with organ dysfunction ([place]) Active Problems:   Acute respiratory failure with hypoxia ([place])   On mechanically assisted ventilation ([place])   Palliative care by specialist   Goals of care, counseling/discussion   Septic shock ([place])   Acute pulmonary edema ([place])   Acute kidney injury ([place])   Hypotension   History of present illness:  HPI on Admission as documented by Admitting Physician:-   Bryan Ho  is a 85 y.o. male, with history of prostate cancer, hypothyroidism, hypertension was brought to ED with chief complaint of shortness of breath.  Patient is currently intubated so history obtained from the ED records.  There was 2-day history of general malaise, failure to thrive at home, not eating or drinking, confusion at home.  Report of recent UTI.  Patient initially was on nonrebreather O2 sats 93%, patient became hypotensive in the ED with systolic blood pressure in 60s despite IV fluids requiring push dose pressor use.  0.1 mg of epinephrine.  He was still breathing 30-40 times a minute and requiring nonrebreather.  Intubation mechanical ventilation was performed after Dr. Sedonia Small discussed with patient's family. Patient started on vancomycin and cefepime.   Hospital Course:   Brief summary 85 y.o.malewith past medical history of prostate cancer, hypothyroidism, hypertensionadmitted on 06/10/2020with gram-negative rod sepsis presumed to be from urinary source with persistent hypotension requiring pressors and acute hypoxic respiratory failure requiring intubation and ventilation shortness of breath.  - Patient was a DNR  Patient expired March 19, 2019 at 2211-01-09 PM   A/p 1)Gram-negative rod  bacteremia and sepsis with septic shock--suspect urinary source,  antibiotics switched from cefepime to high-dose Rocephin pharmacy to adjust, ID and sensitivity of blood cultures from 03-18-19,.. Lactic acidosis persist despite aggressive IV fluids, hypotension persist despite IV Levophed maxed out, IV Neo-Synephrine maxed out, dopamine added for additional pressure support,  patient was gravely ill, overall prognosis was very poor with multiorgan failure.  Metabolic acidosis persist, bicarb given... WBC was up to 41.7  --- Palliative care consult appreciated  Patient was a DNR  Patient expired Mar 19, 2019 at 2211/01/09 PM  2)Acute hypoxic respiratory failure--- secondary to #1 above, appreciate Dr. Luan Pulling pulm consult for vent management---  ----- Palliative care consult appreciated  Patient was a DNR  Patient expired 03/19/2019 at 01/09/2211 PM   3)AKI--creatinine is up to 3.57 from a baseline of 1.1--- suspect due to septic shock with persistent hypotension and decreased renal perfusion,   4)Social/Ethics--plan of care and CODE STATUS discussed with family, patient is a DNR/DNI, patient was intubated and started on pressors,.. They are aware that prognosis is very poor and in-hospital death is very likely.  patient is gravely ill, overall prognosis is very poor.  ----- Palliative care consult appreciated  Patient expired 03/19/19 at Jan 09, 2211 PM  5)Presumed GNR UTI--- treated as above #1,  ------- Palliative care consult appreciated  Patient was a DNR  Patient expired 2019/03/19 at 01-09-2211 PM  Code Status : DNR.... Currently intubated  Family Communication:   wife  Consults  :  Dr Luan Pulling (Pulm)/Palliative care  Time: Patient expired 2019-03-19 at 09-Jan-2211 PM--  Signed:  Tiney Zipper  [facility] 04/07/2019, 10:47 AM

## 2019-04-09 NOTE — Progress Notes (Signed)
ETT tube placement originally at 22 at the lip. Upon going in to advance tube found at 21 at the lip. Advanced tube to 24cm Per MD.

## 2019-04-09 NOTE — Progress Notes (Signed)
Dr. Denton Brick aware of patients blood pressure throughout the day, along with levo and now the neo being at max dose with blood pressure still under 90 systolic. Dopamine to be added and ordered. Dr. Denton Brick aware of urine output of 1102ml (tea colored). Patient has remained alert with eyes open to voice at times and alert with eyes open other times when entering the room. Family in to visit and palliative consulted.

## 2019-04-09 NOTE — Progress Notes (Signed)
CRITICAL VALUE ALERT  Critical Value:  Blood Cultures positive for gram- rods  Date & Time Notied:  03/26/2019 1025  Provider Notified: Maurene Capes, MD  Orders Received/Actions taken: awaiting further instructions at this time  Celestia Khat, RN

## 2019-04-09 NOTE — Consult Note (Signed)
Consultation Note Date: 03/18/2019   Patient Name: Bryan Ho  DOB: 1922/12/02  MRN: [id number]  Age / Sex: 85 y.o., male  PCP: Quintin Alto Silvestre Moment, MD Referring Physician: Roxan Hockey, MD  Reason for Consultation: Establishing goals of care  HPI/Patient Profile: 85 y.o. male  with past medical history of prostate cancer, hypothyroidism, hypertension admitted on 03/24/2019 with shortness of breath. Family reports two day history of general malaise, poor oral intake and confusion at home. Recently treated for UTI. In ED, initially on NRB mask but requiring intubation and mechanical ventilation for acute respiratory failure and tachypnea. ICU admission for urosepsis, AKI, respiratory failure and hypotension requiring 2 pressors. Palliative medicine consultation for goals of care.    Clinical Assessment and Goals of Care:  I have reviewed medical records, discussed with care team, and met with multiple family members in family waiting room including wife Lelan Pons), two sons, and two grandchildren to discuss diagnosis, prognosis, GOC, EOL wishes.  Patient is intubated but not requiring sedation. He is awake and meaningfully attempting to communicate with family. Family reports he is very hard of hearing. No s/s of pain or distress. Hypotension despite levo and neo.   Introduced Palliative Medicine as specialized medical care for people living with serious illness. It focuses on providing relief from the symptoms and stress of a serious illness.  We discussed a brief life review of the patient. Prior to hospitalization, living home and independent with wife of 65 years. They have 5 children and 9 grandchildren. 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 Wife shares that he has only been hospitalized "one day in his life" and that was when he was kept for observation after prostate biopsy.   He is followed outpatient for  hx of prostate cancer with scheduled appointment today.   Discussed events leading up to admission and course of hospitalization including diagnoses, interventions, and poor prognosis. Reiterated Dr. Luan Pulling conversation with wife this AM, with fear that he may not survive this hospitalization with the fact that his body is not responding to aggressive medical interventions.   I attempted to elicit values and goals of care important to the patient and family. Advanced directives, concepts specific to code status, artifical feeding and hydration were discussed. Wife states that they do NOT have a documented living will and have never discussed EOL wishes regarding heroic medical interventions.   Wife confirms her decision for DNR code status if his heart would stop. Agreed with this decision, explaining fear it would cause pain/suffering at the end of his life.  Frankly and compassionately explained that he may pass away on the ventilator and medical recommendation to compassionately extubate him if his blood pressure continues to drop despite these medications. This would allow dignity at EOL. Family processing this conversation and are not at a place to consider extubation today. They wish to continue current plan of care but do understand that he is very sick with poor prognosis.   Questions and concerns were addressed. PMT contact information  given. Emotional support provided.    SUMMARY OF RECOMMENDATIONS    DNR in the event of cardiac arrest.  Otherwise, continue current plan of care and full scope treatment.  Patient does NOT have a documented living will and has never spoken of his wishes to wife or family.   Encouraged family to consider compassionate extubation to allow dignity at EOL if blood pressure continues to drop this afternoon despite 2 vasopressors. Family processing this conversation and not ready to make further decisions today.   PMT will follow. PMT contact information  given to family.   Code Status/Advance Care Planning:  DNR  Symptom Management:   Per attending  Palliative Prophylaxis:   Aspiration, Delirium Protocol, Frequent Pain Assessment, Oral Care and Turn Reposition  Additional Recommendations (Limitations, Scope, Preferences):  DNR in the event of cardiac arrest. Continue current plan of care.   Psycho-social/Spiritual:   Desire for further Chaplaincy support:yes  Additional Recommendations: Caregiving  Support/Resources, Compassionate Wean Education and Education on Hospice  Prognosis:   Poor prognosis  Discharge Planning: To Be Determined      Primary Diagnoses: Present on Admission: . Acute respiratory failure with hypoxia ([place])   I have reviewed the medical record, interviewed the patient and family, and examined the patient. The following aspects are pertinent.  Past Medical History:  Diagnosis Date  . Bone metastases ([place])   . Cardiac murmur   . Dizziness and giddiness   . History of adenocarcinoma of prostate 2001  . Hypertension   . Osteoporosis    Social History   Socioeconomic History  . Marital status: Married    Spouse name: Not on file  . Number of children: Not on file  . Years of education: Not on file  . Highest education level: Not on file  Occupational History  . Not on file  Social Needs  . Financial resource strain: Not on file  . Food insecurity:    Worry: Not on file    Inability: Not on file  . Transportation needs:    Medical: Not on file    Non-medical: Not on file  Tobacco Use  . Smoking status: Former Smoker    Packs/day: 3.00    Years: 40.00    Pack years: 120.00    Types: Cigarettes    Start date: 06/12/1937    Last attempt to quit: 06/12/1977    Years since quitting: 41.7  . Smokeless tobacco: Current User    Types: Snuff  . Tobacco comment: starting dipping snuff at age 32   Substance and Sexual Activity  . Alcohol use: Not on file  . Drug use: Not on file  .  Sexual activity: Not on file  Lifestyle  . Physical activity:    Days per week: Not on file    Minutes per session: Not on file  . Stress: Not on file  Relationships  . Social connections:    Talks on phone: Not on file    Gets together: Not on file    Attends religious service: Not on file    Active member of club or organization: Not on file    Attends meetings of clubs or organizations: Not on file    Relationship status: Not on file  Other Topics Concern  . Not on file  Social History Narrative  . Not on file   History reviewed. No pertinent family history. Scheduled Meds: . chlorhexidine gluconate (MEDLINE KIT)  15 mL Mouth Rinse BID  . Chlorhexidine Gluconate  Cloth  6 each Topical Daily  . enoxaparin (LOVENOX) injection  30 mg Subcutaneous Q24H  . insulin aspart  0-15 Units Subcutaneous Q4H  . levothyroxine  12.5 mcg Intravenous Daily  . mouth rinse  15 mL Mouth Rinse 10 times per day  . [START ON 03/12/2019] pneumococcal 23 valent vaccine  0.5 mL Intramuscular Tomorrow-1000   Continuous Infusions: . cefTRIAXone (ROCEPHIN)  IV 2 g (07-Apr-2019 1553)  . dextrose 5 % and 0.9% NaCl 125 mL/hr at 04-07-19 0935  . norepinephrine (LEVOPHED) Adult infusion 40 mcg/min (2019-04-07 1450)  . phenylephrine (NEO-SYNEPHRINE) Adult infusion 290 mcg/min (04-07-19 1518)  . [START ON 03/12/2019] vancomycin     PRN Meds:. Medications Prior to Admission:  Prior to Admission medications   Medication Sig Start Date End Date Taking? Authorizing Provider  alendronate (FOSAMAX) 70 MG tablet Take 70 mg by mouth once a week.  01/21/19  Yes [provider]  bicalutamide (CASODEX) 50 MG tablet Take 50 mg by mouth daily.  12/26/18  Yes [provider]  diltiazem (TIAZAC) 180 MG 24 hr capsule Take 180 mg by mouth 2 (two) times a day.  02/18/19  Yes [provider]  levothyroxine (SYNTHROID) 25 MCG tablet Take 25 mcg by mouth daily before breakfast.  02/11/19  Yes [provider]  Multiple Vitamin (THERA) TABS Take by mouth. 12/30/13  Yes [provider]  sulfamethoxazole-trimethoprim (BACTRIM DS) 800-160 MG tablet Take 1 tablet by mouth 2 (two) times daily. 02/08/19   [provider]  tamsulosin (FLOMAX) 0.4 MG CAPS capsule Take 1 capsule by mouth daily. 08/14/16   [provider]   No Known Allergies Review of Systems  Unable to perform ROS: Acuity of condition    Physical Exam Vitals signs and nursing note reviewed.  Constitutional:      Appearance: He is cachectic. He is ill-appearing.     Interventions: He is intubated.  HENT:     Head: Normocephalic and atraumatic.  Cardiovascular:     Rate and Rhythm: Normal rate and regular rhythm.     Comments: hypotensive Pulmonary:     Effort: No tachypnea, accessory muscle usage or respiratory distress. He is intubated.     Breath sounds: Rhonchi present.  Abdominal:     Tenderness: There is no abdominal tenderness.  Skin:    General: Skin is warm and dry.     Findings: Ecchymosis present.     Comments: BLE edema  Neurological:     Mental Status: He is easily aroused.     Comments: Awake, alert on vent.      Vital Signs: BP (!) 80/40   Pulse (!) 107   Temp 100.2 F (37.9 C)   Resp (!) 30   Ht '5\' 8"'$  (1.727 m)   Wt 66.7 kg   SpO2 97%   BMI 22.36 kg/m  Pain Scale: CPOT POSS *See Group Information*: S-Acceptable,Sleep, easy to arouse    SpO2: SpO2: 97 % O2 Device:SpO2: 97 % O2 Flow Rate: .O2 Flow Rate (L/min): 15 L/min  IO: Intake/output summary:   Intake/Output Summary (Last 24 hours) at April 07, 2019 1600 Last data filed at 04-07-2019 1450 Gross per 24 hour  Intake 5938.73 ml  Output 2050 ml  Net 3888.73 ml    LBM: Last BM Date: (unknown) Baseline Weight: Weight: 66.7 kg Most recent weight: Weight: 66.7 kg     Palliative Assessment/Data: PPS 20%   Flowsheet Rows     Most Recent Value  Intake Tab  Referral Department  Critical care  Unit at Time of Referral   ICU  Palliative Care Primary Diagnosis  Sepsis/Infectious Disease  Date Notified  Mar 27, 2019  Palliative Care Type  New Palliative care  Reason for referral  Clarify Goals of Care, End of Life Care Assistance  Date first seen by Palliative Care  03-27-2019  # of days Palliative referral response time  0 Day(s)  Clinical Assessment  Palliative Performance Scale Score  20%  Psychosocial & Spiritual Assessment  Palliative Care Outcomes  Patient/Family meeting held?  Yes  Who was at the meeting?  multiple family members including wife and two sons/grandchildren  Palliative Care Outcomes  Clarified goals of care, Provided end of life care assistance, Provided psychosocial or spiritual support, ACP counseling assistance      Time In: 6812 Time Out: 1400 Time Total: 19mn Greater than 50%  of this time was spent counseling and coordinating care related to the above assessment and plan.  Signed by:  MIhor Dow FNP-C Palliative Medicine Team  Phone: [phone]: [phone]  Please contact Palliative Medicine Team phone at [phone] questions and concerns.  For individual provider: See AShea Evans

## 2019-04-09 NOTE — H&P (Addendum)
TRH H&P    Patient Demographics:    Bryan Ho, is a 85 y.o. male  MRN: [id number]  DOB - [birth year]  Admit Date - 03/22/2019  Referring MD/NP/PA: Dr. Sedonia Small  Outpatient Primary MD for the patient is Sasser, Silvestre Moment, MD  Patient coming from: Home  Chief complaint-shortness of breath   HPI:    Bryan Ho  is a 85 y.o. male, with history of prostate cancer, hypothyroidism, hypertension was brought to ED with chief complaint of shortness of breath.  Patient is currently intubated so history obtained from the ED records.  There was 2-day history of general malaise, failure to thrive at home, not eating or drinking, confusion at home.  Report of recent UTI.  Patient initially was on nonrebreather O2 sats 93%, patient became hypotensive in the ED with systolic blood pressure in 60s despite IV fluids requiring push dose pressor use.  0.1 mg of epinephrine.  He was still breathing 30-40 times a minute and requiring nonrebreather.  Intubation mechanical ventilation was performed after Dr. Sedonia Small discussed with patient's family. Patient started on vancomycin and cefepime.    Review of systems:    Review of systems is unobtainable as patient is intubated.    Past History of the following :    Past Medical History:  Diagnosis Date  . Bone metastases ([place])   . Cardiac murmur   . Dizziness and giddiness   . History of adenocarcinoma of prostate 2001  . Hypertension   . Osteoporosis       Past Surgical History:  Procedure Laterality Date  . BIOPSY PROSTATE     20 years ago, cancer bx      Social History:      Social History   Tobacco Use  . Smoking status: Former Smoker    Packs/day: 3.00    Years: 40.00    Pack years: 120.00    Types: Cigarettes    Start date: 06/12/1937    Last attempt to quit: 06/12/1977    Years since quitting: 41.7  . Smokeless tobacco: Current User    Types: Snuff  . Tobacco  comment: starting dipping snuff at age 20   Substance Use Topics  . Alcohol use: Not on file       Family History :   Unobtainable, patient is intubated on mechanical ventilation  Home Medications:   Prior to Admission medications   Medication Sig Start Date End Date Taking? Authorizing Provider  alendronate (FOSAMAX) 70 MG tablet Take 70 mg by mouth once a week.  01/21/19  Yes [provider]  bicalutamide (CASODEX) 50 MG tablet Take 50 mg by mouth daily.  12/26/18  Yes [provider]  diltiazem (TIAZAC) 180 MG 24 hr capsule Take 180 mg by mouth 2 (two) times a day.  02/18/19  Yes [provider]  levothyroxine (SYNTHROID) 25 MCG tablet Take 25 mcg by mouth daily before breakfast.  02/11/19  Yes [provider]  Multiple Vitamin (THERA) TABS Take by mouth. 12/30/13  Yes [provider]  sulfamethoxazole-trimethoprim (  BACTRIM DS) 800-160 MG tablet Take 1 tablet by mouth 2 (two) times daily. 02/08/19   [provider]  tamsulosin (FLOMAX) 0.4 MG CAPS capsule Take 1 capsule by mouth daily. 08/14/16   [provider]     Allergies:    No Known Allergies   Physical Exam:   Vitals  Blood pressure (!) 81/48, pulse 90, temperature 98.6 F (37 C), resp. rate 18, height 5\' 8"  (1.727 m), weight 66.7 kg, SpO2 95 %.  1.  General: Remains intubated  2. Psychiatric: Not tested  3. Neurologic: Sedated, intubated  4. HEENMT:  Atraumatic normocephalic  5. Respiratory : Clear to auscultation bilaterally  6. Cardiovascular : S1-S2, regular, no murmur auscultated  7. Gastrointestinal:  Abdomen is soft, nontender, no organomegaly     Data Review:    CBC Recent Labs  Lab 04/06/2019 2351  WBC 9.2  HGB 10.0*  HCT 31.7*  PLT 170  MCV 100.6*  MCH 31.7  MCHC 31.5  RDW 12.7  LYMPHSABS 0.2*  MONOABS 0.1  EOSABS 0.0  BASOSABS 0.0    ------------------------------------------------------------------------------------------------------------------  Results for orders placed or performed during the hospital encounter of 04/01/2019 (from the past 48 hour(s))  SARS Coronavirus 2 (CEPHEID- Performed in [facility] hospital lab), Hosp Order     Status: None   Collection Time: 03/24/2019 10:23 PM  Result Value Ref Range   SARS Coronavirus 2 NEGATIVE NEGATIVE    Comment: (NOTE) If result is NEGATIVE SARS-CoV-2 target nucleic acids are NOT DETECTED. The SARS-CoV-2 RNA is generally detectable in upper and lower  respiratory specimens during the acute phase of infection. The lowest  concentration of SARS-CoV-2 viral copies this assay can detect is 250  copies / mL. A negative result does not preclude SARS-CoV-2 infection  and should not be used as the sole basis for treatment or other  patient management decisions.  A negative result may occur with  improper specimen collection / handling, submission of specimen other  than nasopharyngeal swab, presence of viral mutation(s) within the  areas targeted by this assay, and inadequate number of viral copies  (<250 copies / mL). A negative result must be combined with clinical  observations, patient history, and epidemiological information. If result is POSITIVE SARS-CoV-2 target nucleic acids are DETECTED. The SARS-CoV-2 RNA is generally detectable in upper and lower  respiratory specimens dur ing the acute phase of infection.  Positive  results are indicative of active infection with SARS-CoV-2.  Clinical  correlation with patient history and other diagnostic information is  necessary to determine patient infection status.  Positive results do  not rule out bacterial infection or co-infection with other viruses. If result is PRESUMPTIVE POSTIVE SARS-CoV-2 nucleic acids MAY BE PRESENT.   A presumptive positive result was obtained on the submitted specimen  and confirmed on repeat  testing.  While 2019 novel coronavirus  (SARS-CoV-2) nucleic acids may be present in the submitted sample  additional confirmatory testing may be necessary for epidemiological  and / or clinical management purposes  to differentiate between  SARS-CoV-2 and other Sarbecovirus currently known to infect humans.  If clinically indicated additional testing with an alternate test  methodology [phone]) is advised. The SARS-CoV-2 RNA is generally  detectable in upper and lower respiratory sp ecimens during the acute  phase of infection. The expected result is Negative. Fact Sheet for Patients:  StrictlyIdeas.no Fact Sheet for Healthcare Providers: BankingDealers.co.za This test is not yet approved or cleared by the [place] FDA and  has been authorized for detection and/or diagnosis of SARS-CoV-2 by FDA under an Emergency Use Authorization (EUA).  This EUA will remain in effect (meaning this test can be used) for the duration of the COVID-19 declaration under Section 564(b)(1) of the Act, 21 U.S.C. section 360bbb-3(b)(1), unless the authorization is terminated or revoked sooner. Performed at [facility], [address] [place], [place] [address]   Urinalysis, Routine w reflex microscopic     Status: Abnormal   Collection Time: 03/28/2019 11:13 PM  Result Value Ref Range   Color, Urine YELLOW YELLOW   APPearance HAZY (A) CLEAR   Specific Gravity, Urine 1.012 1.005 - 1.030   pH 5.0 5.0 - 8.0   Glucose, UA NEGATIVE NEGATIVE mg/dL   Hgb urine dipstick MODERATE (A) NEGATIVE   Bilirubin Urine NEGATIVE NEGATIVE   Ketones, ur NEGATIVE NEGATIVE mg/dL   Protein, ur NEGATIVE NEGATIVE mg/dL   Nitrite NEGATIVE NEGATIVE   Leukocytes,Ua MODERATE (A) NEGATIVE   RBC / HPF 0-5 0 - 5 RBC/hpf   WBC, UA 21-50 0 - 5 WBC/hpf   Bacteria, UA MANY (A) NONE SEEN   Squamous Epithelial / LPF 0-5 0 - 5   WBC Clumps PRESENT     Comment: Performed at [facility], [address] [place], [place] [address]  Blood gas, arterial ([facility], [facility], [facility])     Status: Abnormal   Collection Time: 04/06/2019 11:20 PM  Result Value Ref Range   FIO2 100.00    Delivery systems VENTILATOR    Mode PRESSURE REGULATED VOLUME CONTROL    VT 540 mL   LHR 18 resp/min   Peep/cpap 5.0 cm H20   pH, Arterial 7.254 (L) 7.350 - 7.450   pCO2 arterial 35.1 32.0 - 48.0 mmHg   pO2, Arterial 230 (H) 83.0 - 108.0 mmHg   Bicarbonate 15.8 (L) 20.0 - 28.0 mmol/L   Acid-base deficit 10.7 (H) 0.0 - 2.0 mmol/L   O2 Saturation 98.7 %   Patient temperature 36.9    Collection site RIGHT RADIAL    Drawn by [id number]    Allens test (pass/fail) PASS PASS    Comment: Performed at [facility], [address], [place], [place] [address]  Lactic acid, plasma     Status: Abnormal   Collection Time: 03/19/2019 11:51 PM  Result Value Ref Range   Lactic Acid, Venous 4.9 (HH) 0.5 - 1.9 mmol/L    Comment: CRITICAL RESULT CALLED TO, READ BACK BY AND VERIFIED WITH: PRUITT,G. AT 0141 ON 03/22/19 BY EVA Performed at [facility], [address] [place], [place] [address]   Comprehensive metabolic panel     Status: Abnormal   Collection Time: 03/18/2019 11:51 PM  Result Value Ref Range   Sodium 131 (L) 135 - 145 mmol/L   Potassium 3.9 3.5 - 5.1 mmol/L   Chloride 101 98 - 111 mmol/L   CO2 15 (L) 22 - 32 mmol/L   Glucose, Bld 112 (H) 70 - 99 mg/dL   BUN 68 (H) 8 - 23 mg/dL   Creatinine, Ser 3.63 (H) 0.61 - 1.24 mg/dL   Calcium 7.0 (L) 8.9 - 10.3 mg/dL   Total Protein 4.5 (L) 6.5 - 8.1 g/dL   Albumin 1.9 (L) 3.5 - 5.0 g/dL   AST 40 15 - 41 U/L   ALT 26 0 - 44 U/L   Alkaline Phosphatase 381 (H) 38 - 126 U/L   Total Bilirubin 0.7 0.3 - 1.2 mg/dL   GFR calc non Af Amer 14 (  L) >60 mL/min   GFR calc Af Amer 16 (L) >60 mL/min   Anion gap 15 5 - 15    Comment: Performed at [facility], [address] [place], [place] [address]  CBC WITH DIFFERENTIAL     Status: Abnormal   Collection Time: 04/06/2019 11:51 PM   Result Value Ref Range   WBC 9.2 4.0 - 10.5 K/uL   RBC 3.15 (L) 4.22 - 5.81 MIL/uL   Hemoglobin 10.0 (L) 13.0 - 17.0 g/dL   HCT 31.7 (L) 39.0 - 52.0 %   MCV 100.6 (H) 80.0 - 100.0 fL   MCH 31.7 26.0 - 34.0 pg   MCHC 31.5 30.0 - 36.0 g/dL   RDW 12.7 11.5 - 15.5 %   Platelets 170 150 - 400 K/uL   nRBC 0.0 0.0 - 0.2 %   Neutrophils Relative % 94 %   Neutro Abs 8.8 (H) 1.7 - 7.7 K/uL   Lymphocytes Relative 2 %   Lymphs Abs 0.2 (L) 0.7 - 4.0 K/uL   Monocytes Relative 1 %   Monocytes Absolute 0.1 0.1 - 1.0 K/uL   Eosinophils Relative 0 %   Eosinophils Absolute 0.0 0.0 - 0.5 K/uL   Basophils Relative 0 %   Basophils Absolute 0.0 0.0 - 0.1 K/uL   WBC Morphology      MODERATE LEFT SHIFT (>5% METAS AND MYELOS,OCC PRO NOTED)    Comment: TOXIC GRANULATION VACUOLATED NEUTROPHILS    Immature Granulocytes 3 %   Abs Immature Granulocytes 0.23 (H) 0.00 - 0.07 K/uL    Comment: Performed at [facility], [address], [place], [place] [address]  Brain natriuretic peptide     Status: Abnormal   Collection Time: 03/13/2019 11:51 PM  Result Value Ref Range   B Natriuretic Peptide 361.0 (H) 0.0 - 100.0 pg/mL    Comment: Performed at [facility], [address] [place], [place] [address]  Lactic acid, plasma     Status: Abnormal   Collection Time: Mar 14, 2019  1:49 AM  Result Value Ref Range   Lactic Acid, Venous 4.3 (HH) 0.5 - 1.9 mmol/L    Comment: CRITICAL RESULT CALLED TO, READ BACK BY AND VERIFIED WITH: T TALBOT,RN @0228  03/14/2019 MKELLY Performed at [facility], [address], [place], [place] [address]     Chemistries  Recent Labs  Lab 03/22/2019 2351  NA 131*  K 3.9  CL 101  CO2 15*  GLUCOSE 112*  BUN 68*  CREATININE 3.63*  CALCIUM 7.0*  AST 40  ALT 26  ALKPHOS 381*  BILITOT 0.7   ------------------------------------------------------------------------------------------------------------------   ------------------------------------------------------------------------------------------------------------------ GFR: Estimated Creatinine Clearance: 12 mL/min (A) (by C-G formula based on SCr of 3.63 mg/dL (H)). Liver Function Tests: Recent Labs  Lab 04/02/2019 2351  AST 40  ALT 26  ALKPHOS 381*  BILITOT 0.7  PROT 4.5*  ALBUMIN 1.9*    --------------------------------------------------------------------------------------------------------------- Urine analysis:    Component Value Date/Time   COLORURINE YELLOW 03/29/2019 2313   APPEARANCEUR HAZY (A) 03/20/2019 2313   LABSPEC 1.012 04/02/2019 2313   PHURINE 5.0 03/26/2019 2313   GLUCOSEU NEGATIVE 03/14/2019 2313   HGBUR MODERATE (A) 03/31/2019 2313   BILIRUBINUR NEGATIVE 04/05/2019 2313   KETONESUR NEGATIVE 03/30/2019 2313   PROTEINUR NEGATIVE 03/19/2019 2313   NITRITE NEGATIVE 03/28/2019 2313   LEUKOCYTESUR MODERATE (A) 03/12/2019 2313      Imaging Results:    Dg Chest Portable 1 View  Result Date: March 14, 2019 CLINICAL DATA:  Central line placement EXAM: PORTABLE CHEST 1  VIEW COMPARISON:  03/14/2019 FINDINGS: The right-sided central venous catheter tip projects over the SVC. There is no pneumothorax. The endotracheal tube terminates approximately 6.5 cm above the carina. Cardiac silhouette is unchanged. Diffuse interstitial lung markings are again noted. There are developing small bilateral pleural effusions. IMPRESSION: 1. Well-positioned right-sided central venous catheter without evidence of a right-sided pneumothorax. Remaining lines and tubes as above. 2. Stable cardiac silhouette. Prominent interstitial lung markings are again noted. There are developing small pleural effusions. Findings are suspicious for developing pulmonary edema and volume overload. Electronically Signed   By: Constance Holster M.D.   On: March 23, 2019 01:30   Dg Chest Port 1 View  Result Date: 03/19/2019 CLINICAL DATA:  Intubation. EXAM: PORTABLE CHEST  1 VIEW COMPARISON:  None. FINDINGS: The endotracheal tube terminates above the carina by approximately 6.8 cm. The enteric tube appears to extend below the left hemidiaphragm with tip terminating beyond the gastric antrum. The lung volumes are low. There is scattered pulmonary opacities bilaterally without evidence of a pneumothorax or large pleural effusion. The cardiac size is borderline enlarged. Bibasilar airspace opacities are noted. IMPRESSION: 1. Endotracheal tube terminates approximately 6.8 cm above the carina. The enteric tube extends below the left hemidiaphragm. 2. Low lung volumes with scattered pulmonary opacities bilaterally which are nonspecific and may be secondary to an atypical infectious process. 3. Borderline enlarged heart. Electronically Signed   By: Constance Holster M.D.   On: 04/01/2019 23:02    My personal review of EKG: Rhythm NSR, no ST changes.   Assessment & Plan:    Active Problems:   Acute respiratory failure with hypoxia (HCC)   1. Acute hypoxic respiratory failure-likely multifactorial, chest x-ray showed pulmonary edema also possibility of infiltrate.  Patient started on vancomycin and cefepime.  Currently intubated on mechanical ventilation.  Will continue with antibiotics.  Follow blood culture results.  SARS-CoV-2 is negative.Will consult Dr. Luan Pulling in a.m. for vent management.  2. Septic shock-patient likely has urosepsis also possibility of underlying pneumonia.Lactic acid 4.69.  Currently on vancomycin and cefepime.  He has abnormal UA.  Follow urine culture results.  Blood cultures x2 have been obtained.  Currently patient is requiring Levophed at 15 mcg/min.   3. Acute kidney injury-today patient's creatinine is 3.63, baseline creatinine as of February 2019 was 1.10.  Patient given fluid boluses in the ED.  Follow BMP in a.m.  Will avoid giving extra fluids as patient's chest x-ray showed pulmonary edema.  4. Hypothyroidism-patient takes Synthroid 25 mcg  at home.  Synthroid is currently on hold.  Will start Synthroid 12.5 mcg IV daily.  5. UTI-patient has abnormal UA.  Will obtain urine culture.  Follow urine culture results.     DVT Prophylaxis-   Lovenox   AM Labs Ordered, also please review Full Orders  Family Communication: No family at bedside, ED provider Dr. Sedonia Small discussed with family and patient is full code.  Code Status: Full code  Admission status: Inpatient: Based on patients clinical presentation and evaluation of above clinical data, I have made determination that patient meets Inpatient criteria at this time.  Time spent in minutes : 60 minutes   Oswald Hillock M.D on March 23, 2019 at 2:42 AM

## 2019-04-09 NOTE — ED Notes (Signed)
Report given to Robbie RN in ICU 

## 2019-04-09 NOTE — ED Notes (Signed)
Date and time results received: 03-19-2019 0228 (use smartphrase ".now" to insert current time)  Test: lactic Critical Value: 4.3  Name of Provider Notified: Dr. Darrick Meigs notified by [facility]  Orders Received? Or Actions Taken?: no/na

## 2019-04-09 NOTE — Progress Notes (Signed)
CRITICAL VALUE ALERT  Critical Value: Lactic Acid 3.2 & Calcium 6.1  Date & Time Notied:  03-25-2019 @ 1700  Provider Notified: Dr. Denton Brick  Orders Received/Actions taken: No new orders currently

## 2019-04-09 NOTE — Consult Note (Signed)
Consult requested by: [facility] hospitalist, Dr. Renaldo Reel Consult requested for: Respiratory failure septic shock  HPI: History is obtained from the medical record as patient is intubated and on the ventilator.  He apparently came to the emergency department with shortness of breath.  He has been having general malaise failure to thrive not eating drinking and confusion.  He had apparently had a urinary tract infection.  He initially was placed on a nonrebreather mask but he became hypotensive with systolic blood pressure in the 60s.  He was intubated because of increasing respiratory effort.  He has remained intubated and on the ventilator.  He is on levo fed and still hypotensive.  He is received IV fluids boluses and is still hypotensive.  Lactate is elevated.  Blood gas last night after being intubated showed his pH was 7.25.  On his comp metabolic profile at around midnight last night CO2 was 15.  His BUN was 68 creatinine was 3.63 BNP mildly elevated at 361 lactate was 4.9 at about midnight and 4.3 at around 2 AM.  White blood count 9200.  He is moving around some despite his hypotension.  He has history of prostate cancer and that appears to be recurrent.  Chest x-ray that I have personally reviewed shows what may be some volume overload but it is difficult to tell for sure.  His tubes are in the right position.  The last chest x-ray I see is over 29 years old and that showed evidence of COPD.  He does have substantial smoking history.  Past Medical History:  Diagnosis Date  . Bone metastases ([place])   . Cardiac murmur   . Dizziness and giddiness   . History of adenocarcinoma of prostate 2001  . Hypertension   . Osteoporosis      History reviewed. No pertinent family history.   Social History   Socioeconomic History  . Marital status: Married    Spouse name: Not on file  . Number of children: Not on file  . Years of education: Not on file  . Highest education level: Not on file   Occupational History  . Not on file  Social Needs  . Financial resource strain: Not on file  . Food insecurity:    Worry: Not on file    Inability: Not on file  . Transportation needs:    Medical: Not on file    Non-medical: Not on file  Tobacco Use  . Smoking status: Former Smoker    Packs/day: 3.00    Years: 40.00    Pack years: 120.00    Types: Cigarettes    Start date: 06/12/1937    Last attempt to quit: 06/12/1977    Years since quitting: 41.7  . Smokeless tobacco: Current User    Types: Snuff  . Tobacco comment: starting dipping snuff at age 6   Substance and Sexual Activity  . Alcohol use: Not on file  . Drug use: Not on file  . Sexual activity: Not on file  Lifestyle  . Physical activity:    Days per week: Not on file    Minutes per session: Not on file  . Stress: Not on file  Relationships  . Social connections:    Talks on phone: Not on file    Gets together: Not on file    Attends religious service: Not on file    Active member of club or organization: Not on file    Attends meetings of clubs or organizations: Not on file  Relationship status: Not on file  Other Topics Concern  . Not on file  Social History Narrative  . Not on file     ROS: Unobtainable    Objective: Vital signs in last 24 hours: Temp:  [98.1 F (36.7 C)-99.5 F (37.5 C)] 99.5 F (37.5 C) (06/02 0741) Pulse Rate:  [90-126] 102 (06/02 0741) Resp:  [18-32] 27 (06/02 0741) BP: (60-117)/(33-104) 75/40 (06/02 0545) SpO2:  [86 %-100 %] 93 % (06/02 0741) FiO2 (%):  [60 %-100 %] 60 % (06/02 0330) Weight:  [66.7 kg] 66.7 kg (06/01 2331) Weight change:  Last BM Date: (unknown)  Intake/Output from previous day: 06/01 0701 - 06/02 0700 In: 3497.8 [I.V.:197.8; IV Piggyback:3300] Out: 2050 [Urine:2000; Emesis/NG output:50]  PHYSICAL EXAM Constitutional: He is intubated not sedated moving around a little bit but not really responsive.  Eyes: Pupils react barely.  Ears nose mouth and  throat: He has tubes in his throat mucous membranes are dry.  Cardiovascular: His heart is regular I do not hear a gallop.  Respiratory: He is intubated on the ventilator has some rhonchi bilaterally.  No wheezing.  Gastrointestinal: His abdomen is soft with no masses.  Musculoskeletal: He is moving his arms.  He has trace to 1+ edema of his legs neurological: Cannot assess.  Psychiatric: Cannot assess  Lab Results: Basic Metabolic Panel: Recent Labs    03/20/2019 2351  NA 131*  K 3.9  CL 101  CO2 15*  GLUCOSE 112*  BUN 68*  CREATININE 3.63*  CALCIUM 7.0*   Liver Function Tests: Recent Labs    03/14/2019 2351  AST 40  ALT 26  ALKPHOS 381*  BILITOT 0.7  PROT 4.5*  ALBUMIN 1.9*   No results for input(s): LIPASE, AMYLASE in the last 72 hours. No results for input(s): AMMONIA in the last 72 hours. CBC: Recent Labs    03/24/2019 2351  WBC 9.2  NEUTROABS 8.8*  HGB 10.0*  HCT 31.7*  MCV 100.6*  PLT 170   Cardiac Enzymes: No results for input(s): CKTOTAL, CKMB, CKMBINDEX, TROPONINI in the last 72 hours. BNP: No results for input(s): PROBNP in the last 72 hours. D-Dimer: No results for input(s): DDIMER in the last 72 hours. CBG: Recent Labs    03-14-2019 0405 03/14/19 0739  GLUCAP 130* 83   Hemoglobin A1C: No results for input(s): HGBA1C in the last 72 hours. Fasting Lipid Panel: No results for input(s): CHOL, HDL, LDLCALC, TRIG, CHOLHDL, LDLDIRECT in the last 72 hours. Thyroid Function Tests: No results for input(s): TSH, T4TOTAL, FREET4, T3FREE, THYROIDAB in the last 72 hours. Anemia Panel: No results for input(s): VITAMINB12, FOLATE, FERRITIN, TIBC, IRON, RETICCTPCT in the last 72 hours. Coagulation: No results for input(s): LABPROT, INR in the last 72 hours. Urine Drug Screen: Drugs of Abuse  No results found for: LABOPIA, COCAINSCRNUR, LABBENZ, AMPHETMU, THCU, LABBARB  Alcohol Level: No results for input(s): ETH in the last 72 hours. Urinalysis: Recent Labs     03/18/2019 2313  COLORURINE YELLOW  LABSPEC 1.012  PHURINE 5.0  GLUCOSEU NEGATIVE  HGBUR MODERATE*  BILIRUBINUR NEGATIVE  KETONESUR NEGATIVE  PROTEINUR NEGATIVE  NITRITE NEGATIVE  LEUKOCYTESUR MODERATE*   Misc. Labs:   ABGS: Recent Labs    04/06/2019 2320  PHART 7.254*  PO2ART 230*  HCO3 15.8*     MICROBIOLOGY: Recent Results (from the past 240 hour(s))  Blood Culture (routine x 2)     Status: None (Preliminary result)   Collection Time: 03/24/2019 10:00 PM  Result  Value Ref Range Status   Specimen Description BLOOD LEFT FOREARM  Final   Special Requests   Final    BOTTLES DRAWN AEROBIC AND ANAEROBIC Blood Culture adequate volume   Culture   Final    NO GROWTH < 12 HOURS Performed at [facility], [address] [place], [place] [address]    Report Status PENDING  Incomplete  Blood Culture (routine x 2)     Status: None (Preliminary result)   Collection Time: 03/22/2019 10:00 PM  Result Value Ref Range Status   Specimen Description BLOOD LEFT FOREARM  Final   Special Requests   Final    BOTTLES DRAWN AEROBIC AND ANAEROBIC Blood Culture adequate volume   Culture   Final    NO GROWTH < 12 HOURS Performed at [facility], [address], [place], [place] [address]    Report Status PENDING  Incomplete  SARS Coronavirus 2 (CEPHEID- Performed in [place] hospital lab), Hosp Order     Status: None   Collection Time: 04/03/2019 10:23 PM  Result Value Ref Range Status   SARS Coronavirus 2 NEGATIVE NEGATIVE Final    Comment: (NOTE) If result is NEGATIVE SARS-CoV-2 target nucleic acids are NOT DETECTED. The SARS-CoV-2 RNA is generally detectable in upper and lower  respiratory specimens during the acute phase of infection. The lowest  concentration of SARS-CoV-2 viral copies this assay can detect is 250  copies / mL. A negative result does not preclude SARS-CoV-2 infection  and should not be used as the sole basis for treatment or other  patient management decisions.  A  negative result may occur with  improper specimen collection / handling, submission of specimen other  than nasopharyngeal swab, presence of viral mutation(s) within the  areas targeted by this assay, and inadequate number of viral copies  (<250 copies / mL). A negative result must be combined with clinical  observations, patient history, and epidemiological information. If result is POSITIVE SARS-CoV-2 target nucleic acids are DETECTED. The SARS-CoV-2 RNA is generally detectable in upper and lower  respiratory specimens dur ing the acute phase of infection.  Positive  results are indicative of active infection with SARS-CoV-2.  Clinical  correlation with patient history and other diagnostic information is  necessary to determine patient infection status.  Positive results do  not rule out bacterial infection or co-infection with other viruses. If result is PRESUMPTIVE POSTIVE SARS-CoV-2 nucleic acids MAY BE PRESENT.   A presumptive positive result was obtained on the submitted specimen  and confirmed on repeat testing.  While 2019 novel coronavirus  (SARS-CoV-2) nucleic acids may be present in the submitted sample  additional confirmatory testing may be necessary for epidemiological  and / or clinical management purposes  to differentiate between  SARS-CoV-2 and other Sarbecovirus currently known to infect humans.  If clinically indicated additional testing with an alternate test  methodology [phone]) is advised. The SARS-CoV-2 RNA is generally  detectable in upper and lower respiratory sp ecimens during the acute  phase of infection. The expected result is Negative. Fact Sheet for Patients:  StrictlyIdeas.no Fact Sheet for Healthcare Providers: BankingDealers.co.za This test is not yet approved or cleared by the [place] FDA and has been authorized for detection and/or diagnosis of SARS-CoV-2 by FDA under an Emergency Use  Authorization (EUA).  This EUA will remain in effect (meaning this test can be used) for the duration of the COVID-19 declaration under Section 564(b)(1) of the Act, 21 U.S.C. section 360bbb-3(b)(1), unless the authorization  is terminated or revoked sooner. Performed at [facility], [address] [place], [place] [address]   MRSA PCR Screening     Status: None   Collection Time: 2019-03-28  3:25 AM  Result Value Ref Range Status   MRSA by PCR NEGATIVE NEGATIVE Final    Comment:        The GeneXpert MRSA Assay (FDA approved for NASAL specimens only), is one component of a comprehensive MRSA colonization surveillance program. It is not intended to diagnose MRSA infection nor to guide or monitor treatment for MRSA infections. Performed at [facility], [address] [place], [place] [address]     Studies/Results: Dg Chest Portable 1 View  Result Date: 2019/03/28 CLINICAL DATA:  Central line placement EXAM: PORTABLE CHEST 1 VIEW COMPARISON:  04/06/2019 FINDINGS: The right-sided central venous catheter tip projects over the SVC. There is no pneumothorax. The endotracheal tube terminates approximately 6.5 cm above the carina. Cardiac silhouette is unchanged. Diffuse interstitial lung markings are again noted. There are developing small bilateral pleural effusions. IMPRESSION: 1. Well-positioned right-sided central venous catheter without evidence of a right-sided pneumothorax. Remaining lines and tubes as above. 2. Stable cardiac silhouette. Prominent interstitial lung markings are again noted. There are developing small pleural effusions. Findings are suspicious for developing pulmonary edema and volume overload. Electronically Signed   By: Constance Holster M.D.   On: 2019/03/28 01:30   Dg Chest Port 1 View  Result Date: 03/23/2019 CLINICAL DATA:  Intubation. EXAM: PORTABLE CHEST 1 VIEW COMPARISON:  None. FINDINGS: The endotracheal tube terminates above the carina by approximately 6.8 cm.  The enteric tube appears to extend below the left hemidiaphragm with tip terminating beyond the gastric antrum. The lung volumes are low. There is scattered pulmonary opacities bilaterally without evidence of a pneumothorax or large pleural effusion. The cardiac size is borderline enlarged. Bibasilar airspace opacities are noted. IMPRESSION: 1. Endotracheal tube terminates approximately 6.8 cm above the carina. The enteric tube extends below the left hemidiaphragm. 2. Low lung volumes with scattered pulmonary opacities bilaterally which are nonspecific and may be secondary to an atypical infectious process. 3. Borderline enlarged heart. Electronically Signed   By: Constance Holster M.D.   On: 04/01/2019 23:02    Medications:  Prior to Admission:  Medications Prior to Admission  Medication Sig Dispense Refill Last Dose  . alendronate (FOSAMAX) 70 MG tablet Take 70 mg by mouth once a week.      . bicalutamide (CASODEX) 50 MG tablet Take 50 mg by mouth daily.      Marland Kitchen diltiazem (TIAZAC) 180 MG 24 hr capsule Take 180 mg by mouth 2 (two) times a day.      . levothyroxine (SYNTHROID) 25 MCG tablet Take 25 mcg by mouth daily before breakfast.      . Multiple Vitamin (THERA) TABS Take by mouth.     . sulfamethoxazole-trimethoprim (BACTRIM DS) 800-160 MG tablet Take 1 tablet by mouth 2 (two) times daily.     . tamsulosin (FLOMAX) 0.4 MG CAPS capsule Take 1 capsule by mouth daily.   Taking   Scheduled: . chlorhexidine gluconate (MEDLINE KIT)  15 mL Mouth Rinse BID  . enoxaparin (LOVENOX) injection  30 mg Subcutaneous Q24H  . insulin aspart  0-15 Units Subcutaneous Q4H  . levothyroxine  12.5 mcg Intravenous Daily  . mouth rinse  15 mL Mouth Rinse 10 times per day  . [START ON 03/12/2019] pneumococcal 23 valent vaccine  0.5 mL Intramuscular Tomorrow-1000   Continuous: .  sodium chloride    . norepinephrine (LEVOPHED) Adult infusion 40 mcg/min (Mar 26, 2019 2035)  . phenylephrine (NEO-SYNEPHRINE) Adult infusion  20 mcg/min (2019/03/26 0745)   PRN:  Assesment: He has septic shock.  He is requiring pressors and he is getting ready to be started on the second pressor.  He has had fluid boluses and I am going to have him get another  He may be developing some volume overload but I think he is going to have to have fluid resuscitation.  Repeat chest x-ray this morning is still pending  He has acute hypoxic respiratory failure and he is on the ventilator.  Blood gas this morning is pending  He has renal failure with creatinine last night of 3.63 and the only other creatinine I see in the last year was in February 2019 and that was 1.1.  He has low albumin and likely is malnourished  His alkaline phosphatase is elevated consistent with his metastatic prostate cancer.  He probably has some element of COPD based on remote chest x-ray  He has multisystem failure.  I doubt he will survive this.   Active Problems:   Acute respiratory failure with hypoxia (HCC)    Plan: Continue fluid bolus.  Add Neo-Synephrine.  Continue ventilator support.  Check labs this morning.  Check blood gas this morning.  Repeat chest x-ray this morning repeat lactate this morning    LOS: 0 days   Alonza Bogus Mar 26, 2019, 7:47 AM

## 2019-04-09 NOTE — ED Notes (Signed)
Central line placed by Dr. Sedonia Small

## 2019-04-09 NOTE — Progress Notes (Addendum)
Patient Demographics:    Bryan Ho, is a 85 y.o. male, DOB - Jan 30, 1923, UYW:[id number]  Admit date - 04/06/2019   Admitting Physician Oswald Hillock, MD  Outpatient Primary MD for the patient is Sasser, Silvestre Moment, MD  LOS - 0   Chief Complaint  Patient presents with  . Shortness of Breath        Subjective:    Bryan Ho today has fevers,  persistent hypotension persistent hypoxia--- very poor prognosis  Assessment  & Plan :    Principal Problem:   Gram-negative sepsis with organ dysfunction [facility]) Active Problems:   Acute respiratory failure with hypoxia (HCC)   On mechanically assisted ventilation ([place])   Palliative care by specialist   Goals of care, counseling/discussion   Septic shock ([place])   Acute pulmonary edema ([place])   Acute kidney injury ([place])   Hypotension  Brief summary 85 y.o. male  with past medical history of prostate cancer, hypothyroidism, hypertension admitted on 26-Mar-2019 with gram-negative rod sepsis presumed to be from urinary source with persistent hypotension requiring pressors and acute hypoxic respiratory failure requiring intubation and ventilation shortness of breath.      A/p 1)Gram-negative rod  bacteremia and sepsis with septic shock--suspect urinary source, antibiotics switched from cefepime to high-dose Rocephin pharmacy to adjust, ID and sensitivity of blood cultures from 03/25/2019,.. Lactic acidosis persist despite aggressive IV fluids, hypotension persist despite IV Levophed maxed out, IV Neo-Synephrine maxed out, dopamine added for additional pressure support,  patient is gravely ill, overall prognosis is very poor with multiorgan failure.  Metabolic acidosis persist, bicarb given... WBC is now up to 41.7  2)Acute hypoxic respiratory failure--- secondary to #1 above, appreciate Dr. Luan Pulling pulm consult for vent management---    3)AKI--creatinine is up to 3.57  from a baseline of 1.1--- suspect due to septic shock with persistent hypotension and decreased renal perfusion, avoid nephrotoxic agents continue to aggressively hydrate  4)Social/Ethics--plan of care and CODE STATUS discussed with family, patient is a DNR/DNI, family would like [place] to keep him intubated, continue pressors continue aggressive treatment for now... They are aware that prognosis is very poor and in-hospital death is very likely.  patient is gravely ill, overall prognosis is very poor.   5)Presumed GNR UTI--- treat as above #1, final ID and sensitivity pending  Disposition/Need for in-Hospital Stay- patient unable to be discharged at this time due to sepsis with septic shock multiorgan failure-requiring intubation, ventilation, IV fluids, IV pressors for pressure support  CRITICAL CARE Performed by: Roxan Hockey   Total critical care time: 47 minutes  Critical care time was exclusive of separately billable procedures and treating other patients.  Critical care was necessary to treat or prevent imminent or life-threatening deterioration.  Patient with acute respiratory failure required intubation and ventilatory support, patient with persistent hypotension requiring IV pressors and IV fluids  Critical care was time spent personally by me on the following activities: development of treatment plan with patient and/or surrogate as well as nursing, discussions with consultants, evaluation of patient's response to treatment, examination of patient, obtaining history from patient or surrogate, ordering and performing treatments and interventions, ordering and review of laboratory studies, ordering and review of radiographic studies, pulse oximetry and re-evaluation of patient's  condition.  Code Status : DNR.... Currently intubated  Family Communication:   wife  Disposition Plan  : TBD  Consults  :  Dr Luan Pulling (Pulm)/Palliative care  DVT Prophylaxis  :  Lovenox - SCDs   Lab  Results  Component Value Date   PLT 186 03/12/2019   Inpatient Medications  Scheduled Meds: . chlorhexidine gluconate (MEDLINE KIT)  15 mL Mouth Rinse BID  . Chlorhexidine Gluconate Cloth  6 each Topical Daily  . enoxaparin (LOVENOX) injection  30 mg Subcutaneous Q24H  . insulin aspart  0-15 Units Subcutaneous Q4H  . levothyroxine  12.5 mcg Intravenous Daily  . mouth rinse  15 mL Mouth Rinse 10 times per day  . [START ON 03/12/2019] pneumococcal 23 valent vaccine  0.5 mL Intramuscular Tomorrow-1000  . sodium bicarbonate  50 mEq Intravenous Q3H   Continuous Infusions: . cefTRIAXone (ROCEPHIN)  IV 2 g (03/12/2019 1553)  . dextrose 5 % and 0.9% NaCl 125 mL/hr at March 12, 2019 0935  . famotidine (PEPCID) IV    . norepinephrine (LEVOPHED) Adult infusion 40 mcg/min (03/12/19 1846)  . phenylephrine (NEO-SYNEPHRINE) Adult infusion 400 mcg/min (03/12/19 1846)  . potassium chloride 100 mL/hr at Mar 12, 2019 1846  . [START ON 03/12/2019] vancomycin     PRN Meds:.  Anti-infectives (From admission, onward)   Start     Dose/Rate Route Frequency Ordered Stop   03/12/19 2200  vancomycin (VANCOCIN) IVPB 1000 mg/200 mL premix     1,000 mg 200 mL/hr over 60 Minutes Intravenous Every 48 hours 12-Mar-2019 0758     03/12/19 2200  ceFEPIme (MAXIPIME) 1 g in sodium chloride 0.9 % 100 mL IVPB  Status:  Discontinued     1 g 200 mL/hr over 30 Minutes Intravenous Every 24 hours March 12, 2019 0756 03/12/2019 1539   03-12-2019 1545  cefTRIAXone (ROCEPHIN) 2 g in sodium chloride 0.9 % 100 mL IVPB     2 g 200 mL/hr over 30 Minutes Intravenous Every 24 hours 03-12-19 1539     2019-03-12 1000  meropenem (MERREM) 500 mg in sodium chloride 0.9 % 100 mL IVPB  Status:  Discontinued     500 mg 200 mL/hr over 30 Minutes Intravenous Every 12 hours March 12, 2019 0754 March 12, 2019 0754   03/31/2019 2215  vancomycin (VANCOCIN) IVPB 1000 mg/200 mL premix     1,000 mg 200 mL/hr over 60 Minutes Intravenous  Once 04/05/2019 2207 03/19/2019 2355   03/26/2019 2215   ceFEPIme (MAXIPIME) 2 g in sodium chloride 0.9 % 100 mL IVPB     2 g 200 mL/hr over 30 Minutes Intravenous  Once 03/25/2019 2207 03/14/2019 2300        Objective:   Vitals:   03-12-2019 1745 03/12/2019 1800 12-Mar-2019 1815 03-12-19 1830  BP: (!) 65/48 (!) 70/46 (!) 66/48 (!) 72/48  Pulse: (!) 106 (!) 110 (!) 116 (!) 121  Resp: (!) 27 (!) 29 (!) 29 (!) 22  Temp: 100 F (37.8 C) 100.2 F (37.9 C) 100.2 F (37.9 C) 100.2 F (37.9 C)  TempSrc:      SpO2: 97% 97% 99% 98%  Weight:      Height:        Wt Readings from Last 3 Encounters:  03/19/2019 66.7 kg  09/11/16 66.7 kg  07/12/16 62.1 kg     Intake/Output Summary (Last 24 hours) at 03-12-19 1846 Last data filed at Mar 12, 2019 1846 Gross per 24 hour  Intake 7837.42 ml  Output 2050 ml  Net 5787.42 ml     Physical  Exam Patient is examined daily including today on 03-28-19 , exams remain the same as of yesterday except that has changed   Gen:- Awake .... HEENT:-Intubated, ET and  OG tube noted neck-right IJ central line cath Lungs-diminished in bases, no wheezing  CV- S1, S2 normal, regular  Abd-  +ve B.Sounds, Abd Soft, No tenderness,    Extremity/Skin:- No  edema, pedal pulses present  Psych-awake, intubated,  neuro-awake, no sedation, appears to be moving extremities   Data Review:   Micro Results Recent Results (from the past 240 hour(s))  Blood Culture (routine x 2)     Status: None (Preliminary result)   Collection Time: 03/25/2019 10:00 PM  Result Value Ref Range Status   Specimen Description   Final    BLOOD LEFT FOREARM Performed at [facility], [address], [place], [place] [address]    Special Requests   Final    BOTTLES DRAWN AEROBIC AND ANAEROBIC Blood Culture adequate volume Performed at [facility], [address] [place], [place] [address]    Culture  Setup Time   Final    GRAM NEGATIVE RODS BOTH AEROBIC AND ANAEROBIC Gram Stain Report Called to,Read Back By and Verified With: [place]  [id number] BY THOMPSON S. Organism ID to follow Performed at [facility], [place] [address], [place], [place] [address]    Culture GRAM NEGATIVE RODS  Final   Report Status PENDING  Incomplete  Blood Culture (routine x 2)     Status: None (Preliminary result)   Collection Time: 03/30/2019 10:00 PM  Result Value Ref Range Status   Specimen Description   Final    BLOOD LEFT FOREARM Performed at [facility], [address] [place], [place] [address]    Special Requests   Final    BOTTLES DRAWN AEROBIC AND ANAEROBIC Blood Culture adequate volume Performed at [facility], [address], [place], [place] [address]    Culture  Setup Time   Final    GRAM NEGATIVE RODS BOTH AEROBIC AND ANAEROBIC Gram Stain Report Called to,Read Back By and Verified With: DISHMON M. AT 1026A ON [id number] BY THOMPSON S. CRITICAL VALUE NOTED.  VALUE IS CONSISTENT WITH PREVIOUSLY REPORTED AND CALLED VALUE. Performed at [facility], [place] [address] [place], [place] [address]    Culture GRAM NEGATIVE RODS  Final   Report Status PENDING  Incomplete  Blood Culture ID Panel (Reflexed)     Status: Abnormal   Collection Time: 03/29/2019 10:00 PM  Result Value Ref Range Status   Enterococcus species NOT DETECTED NOT DETECTED Final   Listeria monocytogenes NOT DETECTED NOT DETECTED Final   Staphylococcus species NOT DETECTED NOT DETECTED Final   Staphylococcus aureus (BCID) NOT DETECTED NOT DETECTED Final   Streptococcus species NOT DETECTED NOT DETECTED Final   Streptococcus agalactiae NOT DETECTED NOT DETECTED Final   Streptococcus pneumoniae NOT DETECTED NOT DETECTED Final   Streptococcus pyogenes NOT DETECTED NOT DETECTED Final   Acinetobacter baumannii NOT DETECTED NOT DETECTED Final   Enterobacteriaceae species DETECTED (A) NOT DETECTED Final    Comment: Enterobacteriaceae represent a large family of gram negative bacteria, not a single organism. Refer to culture for further identification. CRITICAL RESULT  CALLED TO, READ BACK BY AND VERIFIED WITH: G. Coffee PharmD 15:35 2019-03-28 ([email or web address])    Enterobacter cloacae complex NOT DETECTED NOT DETECTED Final   Escherichia coli NOT DETECTED NOT DETECTED Final   Klebsiella oxytoca NOT DETECTED NOT DETECTED Final  Klebsiella pneumoniae NOT DETECTED NOT DETECTED Final   Proteus species NOT DETECTED NOT DETECTED Final   Serratia marcescens NOT DETECTED NOT DETECTED Final   Carbapenem resistance NOT DETECTED NOT DETECTED Final   Haemophilus influenzae NOT DETECTED NOT DETECTED Final   Neisseria meningitidis NOT DETECTED NOT DETECTED Final   Pseudomonas aeruginosa NOT DETECTED NOT DETECTED Final   Candida albicans NOT DETECTED NOT DETECTED Final   Candida glabrata NOT DETECTED NOT DETECTED Final   Candida krusei NOT DETECTED NOT DETECTED Final   Candida parapsilosis NOT DETECTED NOT DETECTED Final   Candida tropicalis NOT DETECTED NOT DETECTED Final    Comment: Performed at [facility], [place] [address] [place], [place] [address]  SARS Coronavirus 2 (CEPHEID- Performed in [place] hospital lab), Hosp Order     Status: None   Collection Time: 03/20/2019 10:23 PM  Result Value Ref Range Status   SARS Coronavirus 2 NEGATIVE NEGATIVE Final    Comment: (NOTE) If result is NEGATIVE SARS-CoV-2 target nucleic acids are NOT DETECTED. The SARS-CoV-2 RNA is generally detectable in upper and lower  respiratory specimens during the acute phase of infection. The lowest  concentration of SARS-CoV-2 viral copies this assay can detect is 250  copies / mL. A negative result does not preclude SARS-CoV-2 infection  and should not be used as the sole basis for treatment or other  patient management decisions.  A negative result may occur with  improper specimen collection / handling, submission of specimen other  than nasopharyngeal swab, presence of viral mutation(s) within the  areas targeted by this assay, and inadequate number of viral copies  (<250 copies  / mL). A negative result must be combined with clinical  observations, patient history, and epidemiological information. If result is POSITIVE SARS-CoV-2 target nucleic acids are DETECTED. The SARS-CoV-2 RNA is generally detectable in upper and lower  respiratory specimens dur ing the acute phase of infection.  Positive  results are indicative of active infection with SARS-CoV-2.  Clinical  correlation with patient history and other diagnostic information is  necessary to determine patient infection status.  Positive results do  not rule out bacterial infection or co-infection with other viruses. If result is PRESUMPTIVE POSTIVE SARS-CoV-2 nucleic acids MAY BE PRESENT.   A presumptive positive result was obtained on the submitted specimen  and confirmed on repeat testing.  While 2019 novel coronavirus  (SARS-CoV-2) nucleic acids may be present in the submitted sample  additional confirmatory testing may be necessary for epidemiological  and / or clinical management purposes  to differentiate between  SARS-CoV-2 and other Sarbecovirus currently known to infect humans.  If clinically indicated additional testing with an alternate test  methodology [phone]) is advised. The SARS-CoV-2 RNA is generally  detectable in upper and lower respiratory sp ecimens during the acute  phase of infection. The expected result is Negative. Fact Sheet for Patients:  StrictlyIdeas.no Fact Sheet for Healthcare Providers: BankingDealers.co.za This test is not yet approved or cleared by the [place] FDA and has been authorized for detection and/or diagnosis of SARS-CoV-2 by FDA under an Emergency Use Authorization (EUA).  This EUA will remain in effect (meaning this test can be used) for the duration of the COVID-19 declaration under Section 564(b)(1) of the Act, 21 U.S.C. section 360bbb-3(b)(1), unless the authorization is terminated or revoked sooner.  Performed at [facility], [address], [place], [place] [address]   MRSA PCR Screening     Status: None   Collection  Time: March 14, 2019  3:25 AM  Result Value Ref Range Status   MRSA by PCR NEGATIVE NEGATIVE Final    Comment:        The GeneXpert MRSA Assay (FDA approved for NASAL specimens only), is one component of a comprehensive MRSA colonization surveillance program. It is not intended to diagnose MRSA infection nor to guide or monitor treatment for MRSA infections. Performed at [facility], [address] [place], [place] [address]     Radiology Reports Dg Chest Portable 1 View  Result Date: 03-14-19 CLINICAL DATA:  Central line placement EXAM: PORTABLE CHEST 1 VIEW COMPARISON:  03/22/2019 FINDINGS: The right-sided central venous catheter tip projects over the SVC. There is no pneumothorax. The endotracheal tube terminates approximately 6.5 cm above the carina. Cardiac silhouette is unchanged. Diffuse interstitial lung markings are again noted. There are developing small bilateral pleural effusions. IMPRESSION: 1. Well-positioned right-sided central venous catheter without evidence of a right-sided pneumothorax. Remaining lines and tubes as above. 2. Stable cardiac silhouette. Prominent interstitial lung markings are again noted. There are developing small pleural effusions. Findings are suspicious for developing pulmonary edema and volume overload. Electronically Signed   By: Constance Holster M.D.   On: 03/14/2019 01:30   Dg Chest Port 1 View  Result Date: 04/06/2019 CLINICAL DATA:  Intubation. EXAM: PORTABLE CHEST 1 VIEW COMPARISON:  None. FINDINGS: The endotracheal tube terminates above the carina by approximately 6.8 cm. The enteric tube appears to extend below the left hemidiaphragm with tip terminating beyond the gastric antrum. The lung volumes are low. There is scattered pulmonary opacities bilaterally without evidence of a pneumothorax or large pleural effusion. The  cardiac size is borderline enlarged. Bibasilar airspace opacities are noted. IMPRESSION: 1. Endotracheal tube terminates approximately 6.8 cm above the carina. The enteric tube extends below the left hemidiaphragm. 2. Low lung volumes with scattered pulmonary opacities bilaterally which are nonspecific and may be secondary to an atypical infectious process. 3. Borderline enlarged heart. Electronically Signed   By: Constance Holster M.D.   On: 03/12/2019 23:02   Dg Chest Port 1v Same Day  Result Date: 03/14/2019 CLINICAL DATA:  Sepsis.  Respiratory failure. EXAM: PORTABLE CHEST 1 VIEW COMPARISON:  2019-03-14. FINDINGS: Endotracheal tube, NG tube, right IJ line stable position. Cardiomegaly with diffuse bilateral pulmonary infiltrates/edema again noted. Bilateral pleural effusions again noted. Similar findings on prior exam. No pneumothorax IMPRESSION: 1.  Lines and tubes stable position. 2. Cardiomegaly with bilateral pulmonary infiltrates/edema and bilateral small pleural effusions again noted. Findings consistent CHF. Similar findings noted on prior exam. Electronically Signed   By: [place]   On: March 14, 2019 09:33     CBC Recent Labs  Lab 04/08/2019 2351 March 14, 2019 0855  WBC 9.2 41.7*  HGB 10.0* 9.9*  HCT 31.7* 29.7*  PLT 170 186  MCV 100.6* 99.3  MCH 31.7 33.1  MCHC 31.5 33.3  RDW 12.7 13.0  LYMPHSABS 0.2* 0.3*  MONOABS 0.1 1.3*  EOSABS 0.0 0.2  BASOSABS 0.0 0.1    Chemistries  Recent Labs  Lab 03/12/2019 2351 14-Mar-2019 0855  NA 131* 132*  K 3.9 3.3*  CL 101 106  CO2 15* 13*  GLUCOSE 112* 79  BUN 68* 64*  CREATININE 3.63* 3.57*  CALCIUM 7.0* 6.1*  AST 40 51*  ALT 26 31  ALKPHOS 381* 205*  BILITOT 0.7 0.4   ------------------------------------------------------------------------------------------------------------------ No results for input(s): CHOL, HDL, LDLCALC, TRIG, CHOLHDL, LDLDIRECT in the last 72 hours.  No results found for: HGBA1C  ------------------------------------------------------------------------------------------------------------------  No results for input(s): TSH, T4TOTAL, T3FREE, THYROIDAB in the last 72 hours.  Invalid input(s): FREET3 ------------------------------------------------------------------------------------------------------------------ No results for input(s): VITAMINB12, FOLATE, FERRITIN, TIBC, IRON, RETICCTPCT in the last 72 hours.  Coagulation profile No results for input(s): INR, PROTIME in the last 168 hours.  No results for input(s): DDIMER in the last 72 hours.  Cardiac Enzymes No results for input(s): CKMB, TROPONINI, MYOGLOBIN in the last 168 hours.  Invalid input(s): CK ------------------------------------------------------------------------------------------------------------------    Component Value Date/Time   BNP 361.0 (H) 03/30/2019 2351     Roxan Hockey M.D on 04/07/2019 at 6:46 PM  Go to [email or web address] - for contact info  [facility] - Office  [phone]

## 2019-04-09 NOTE — Progress Notes (Signed)
I updated Bryan Ho by phone and explained to Bryan Ho how sick he is and that it is my concern that he will not survive.  We discussed changing his status to DNR and she agrees to do that.  She does want [place] to continue efforts but if his heart stops not to do CPR.  The telephone number in the chart for Bryan Ho is wrong and the correct telephone number is [phone]

## 2019-04-09 NOTE — ED Notes (Signed)
CRITICAL VALUE ALERT  Critical Value: Lactic Acid 4.9 Date & Time Notied:  03/20/2019 @ 0140 Provider Notified:Dr Lisabeth Devoid Orders Received/Actions taken: None yet

## 2019-04-09 NOTE — Progress Notes (Signed)
In room with patient at 2200. Noticed increased HR to 150s, agitated. Tried to advise pt to try to relax and remain calm.  HR then dropped to the 70s. Unable to obtain a bp at this time. Noticed a color change in the patient who became quite gray and pale.  No pulse could be palpated at this time. Pt was then seen to be in asystole on the monitor. No heart sounds auscultated. No pulse palpated. These were verified by 2nd RN, Gershon Cull.  Time of death 2211-01-14. Family made aware via telephone call.

## 2019-04-09 NOTE — Progress Notes (Signed)
Pharmacy Antibiotic Note  Bryan Ho is a 85 y.o. male admitted on 04/04/2019 with sepsis.  Pharmacy has been consulted for Vancomycin and Cefepime dosing.  Plan: Vancomycin 1000 mg IV every 48 hours.  Goal trough 15-20 mcg/mL.  Cefepime 1000 mg IV every 24 hours. Monitor labs, c/s, and vanco levels as indicated.  Height: 5\' 8"  (172.7 cm) Weight: 147 lb 0.8 oz (66.7 kg) IBW/kg (Calculated) : 68.4  Temp (24hrs), Avg:98.6 F (37 C), Min:98.1 F (36.7 C), Max:99.5 F (37.5 C)  Recent Labs  Lab 03/12/2019 2351 03-14-2019 0149  WBC 9.2  --   CREATININE 3.63*  --   LATICACIDVEN 4.9* 4.3*    Estimated Creatinine Clearance: 12 mL/min (A) (by C-G formula based on SCr of 3.63 mg/dL (H)).    No Known Allergies  Antimicrobials this admission: Vanco 6/2 >>  Cefepime 6/2 >>   Dose adjustments this admission: Vanco/Cefepime  Microbiology results: 6/1 BCx: pending 6/2 UCx: pending   6/2 MRSA PCR: negative  Thank you for allowing pharmacy to be a part of this patient's care.  Ramond Craver March 14, 2019 7:59 AM

## 2019-04-09 NOTE — Progress Notes (Signed)
Patient transported from ED to ICU 07 without any complications.

## 2019-04-09 DEATH — deceased

## 2019-08-11 IMAGING — CR PORTABLE CHEST - 1 VIEW
2 series · 4 of 4 positions shown · non-contrast
Comparison: None.

CLINICAL DATA: Intubation.

EXAM:
PORTABLE CHEST 1 VIEW

[Series 1: portable · 0.17mm/px · 2 of 2 slices shown (1 of 2)]
[im 1/2]
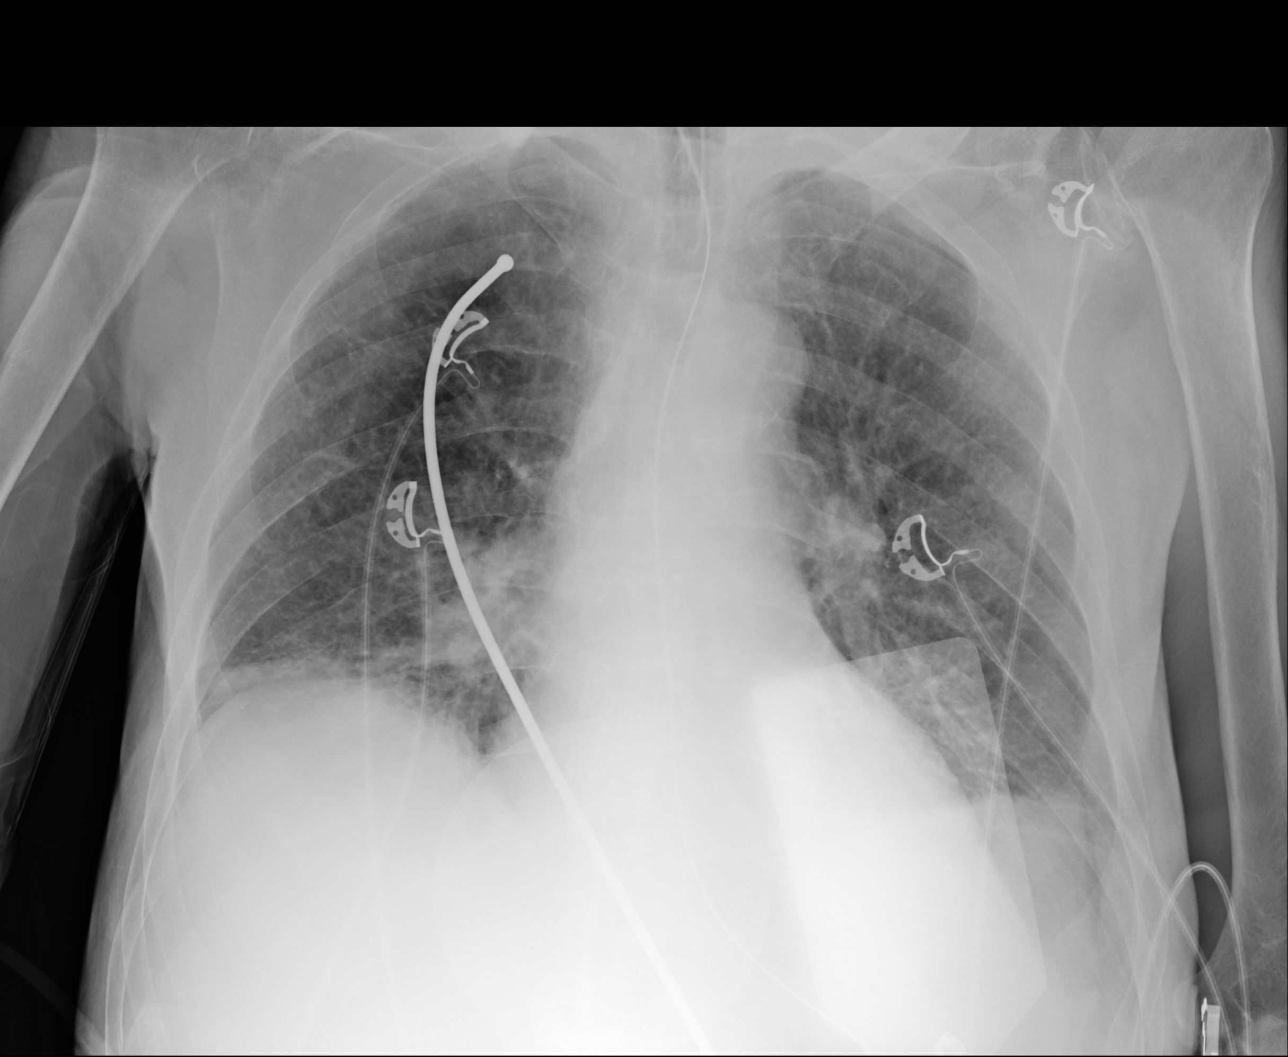
[im 2/2]
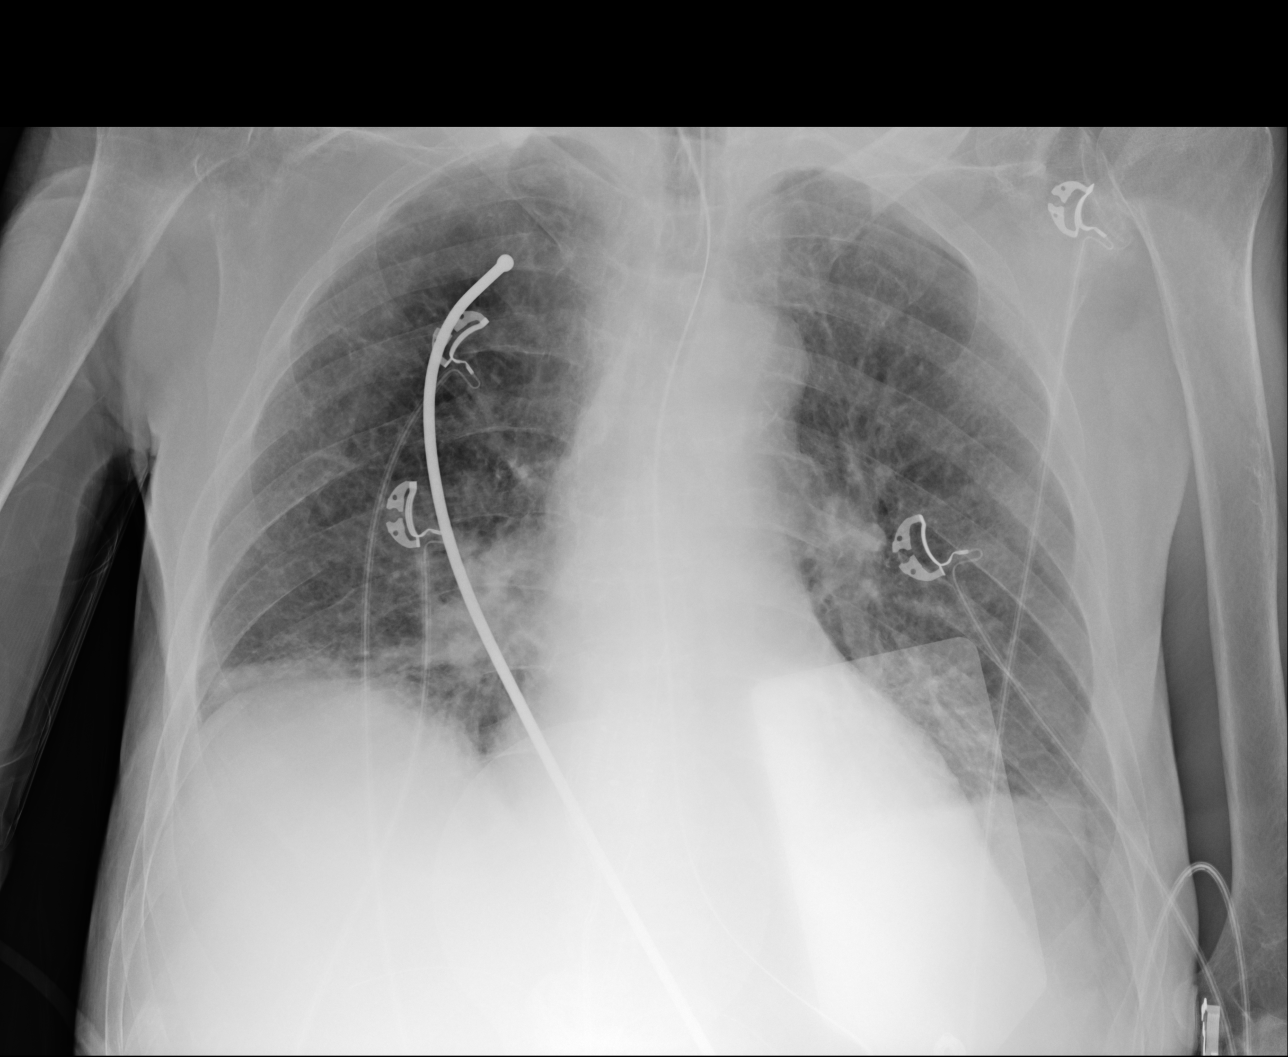

[Series 2: portable · 0.17mm/px · 2 of 2 slices shown (2 of 2)]
[im 1/2]
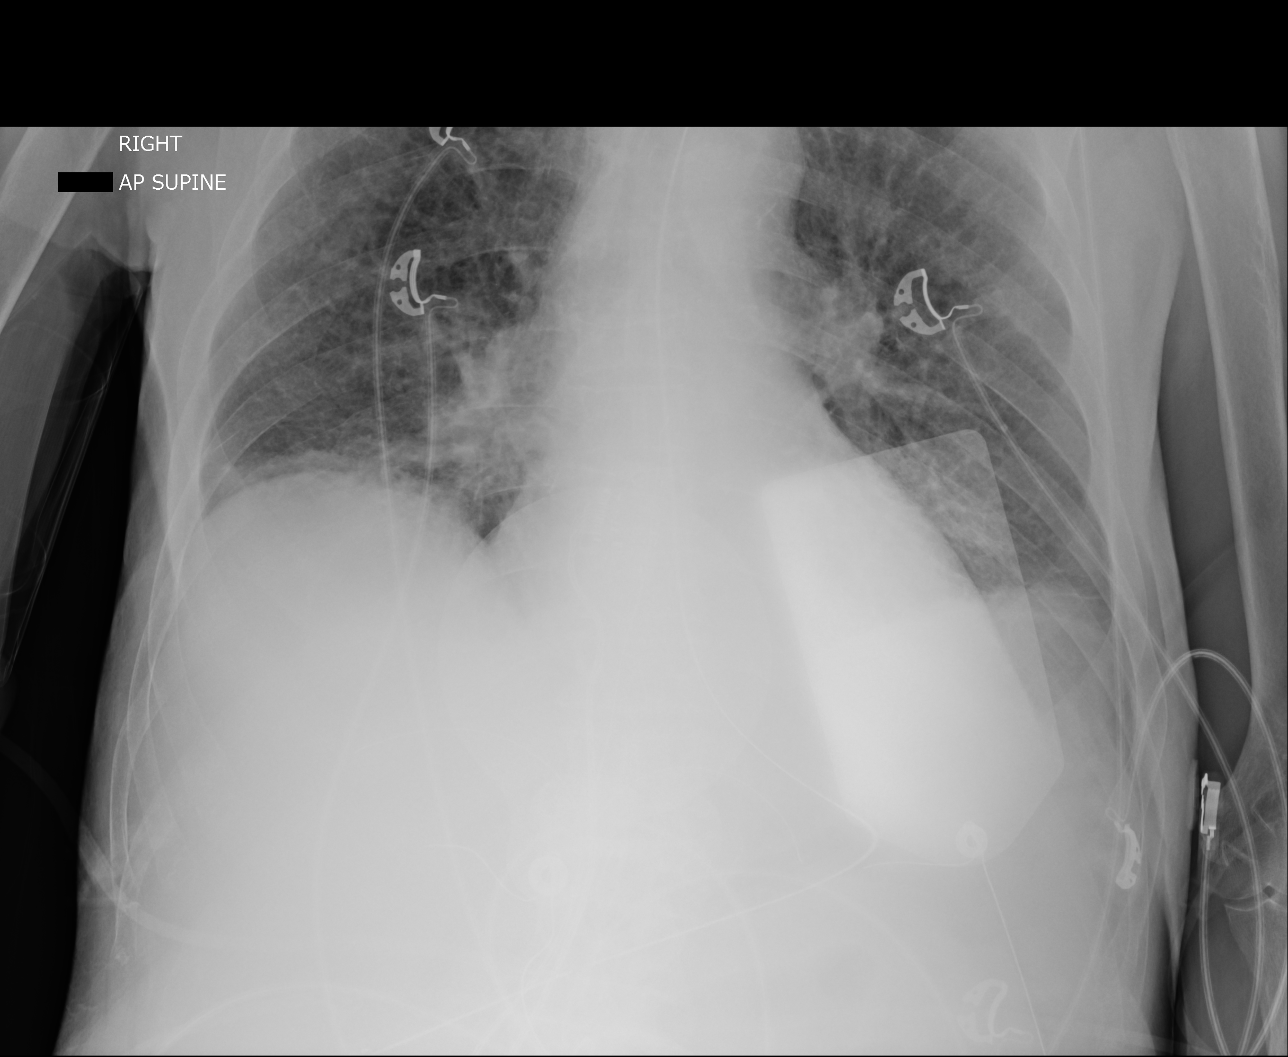
[im 2/2]
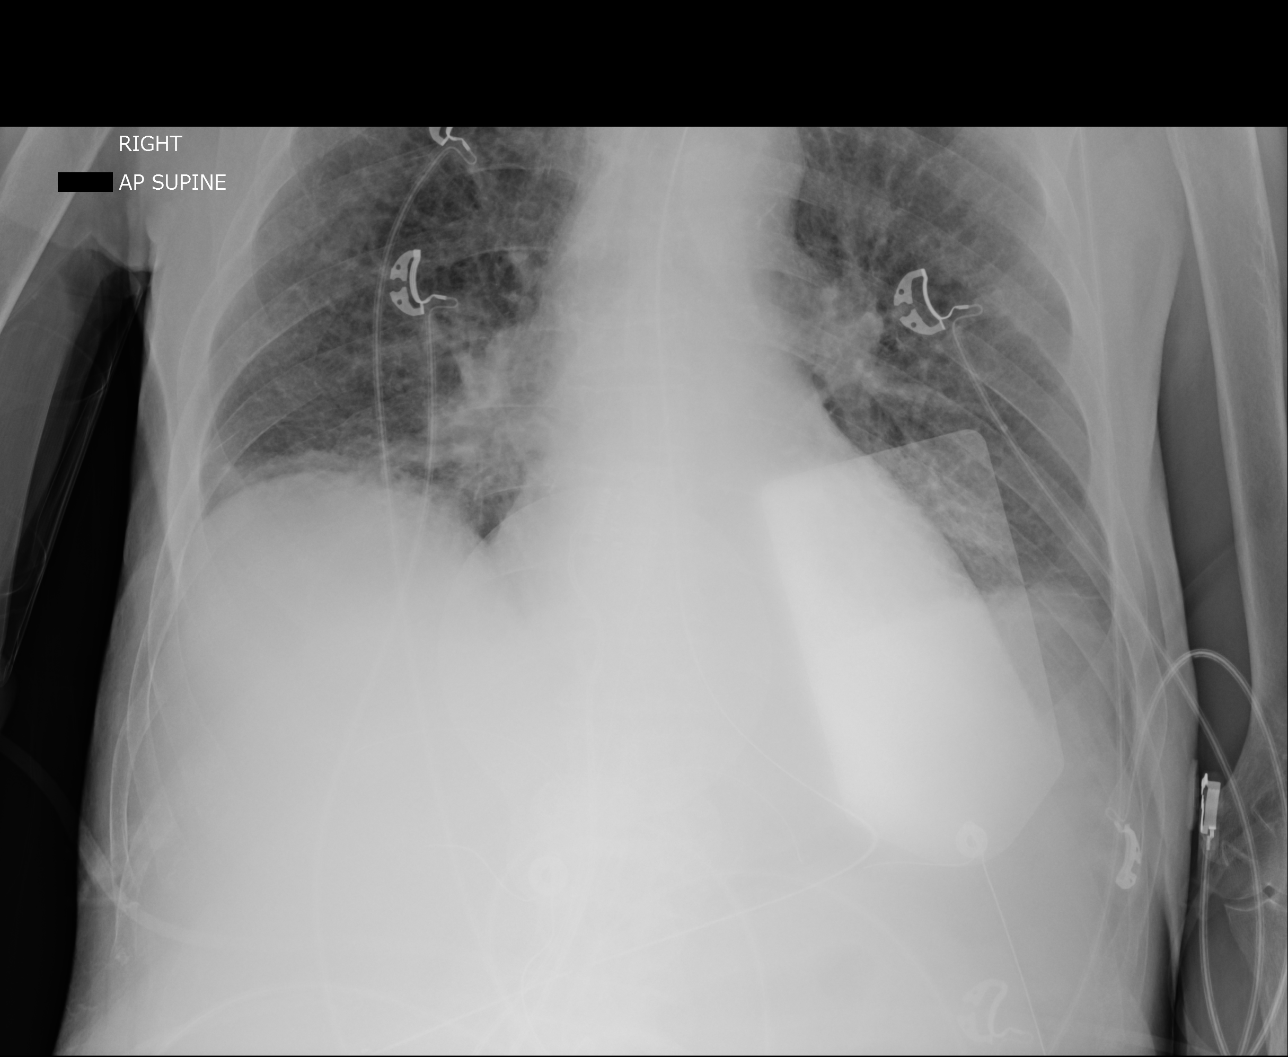

[4 of 4 positions shown; findings below may reference images not displayed]

FINDINGS: The endotracheal tube terminates above the carina by approximately
6.8 cm. The enteric tube appears to extend below the left
hemidiaphragm with tip terminating beyond the gastric antrum. The
lung volumes are low. There is scattered pulmonary opacities
bilaterally without evidence of a pneumothorax or large pleural
effusion. The cardiac size is borderline enlarged. Bibasilar
airspace opacities are noted.
IMPRESSION: 1. Endotracheal tube terminates approximately 6.8 cm above the
carina. The enteric tube extends below the left hemidiaphragm.
2. Low lung volumes with scattered pulmonary opacities bilaterally
which are nonspecific and may be secondary to an atypical infectious
process.
3. Borderline enlarged heart.

## 2019-08-12 IMAGING — CR PORTABLE CHEST - 1 VIEW
1 series · 2 of 2 positions shown · non-contrast
Comparison: 03/10/2019

CLINICAL DATA: Central line placement

EXAM:
PORTABLE CHEST 1 VIEW

[Series 1: portable · 0.17mm/px · 2 of 2 slices shown]
[im 1/2]
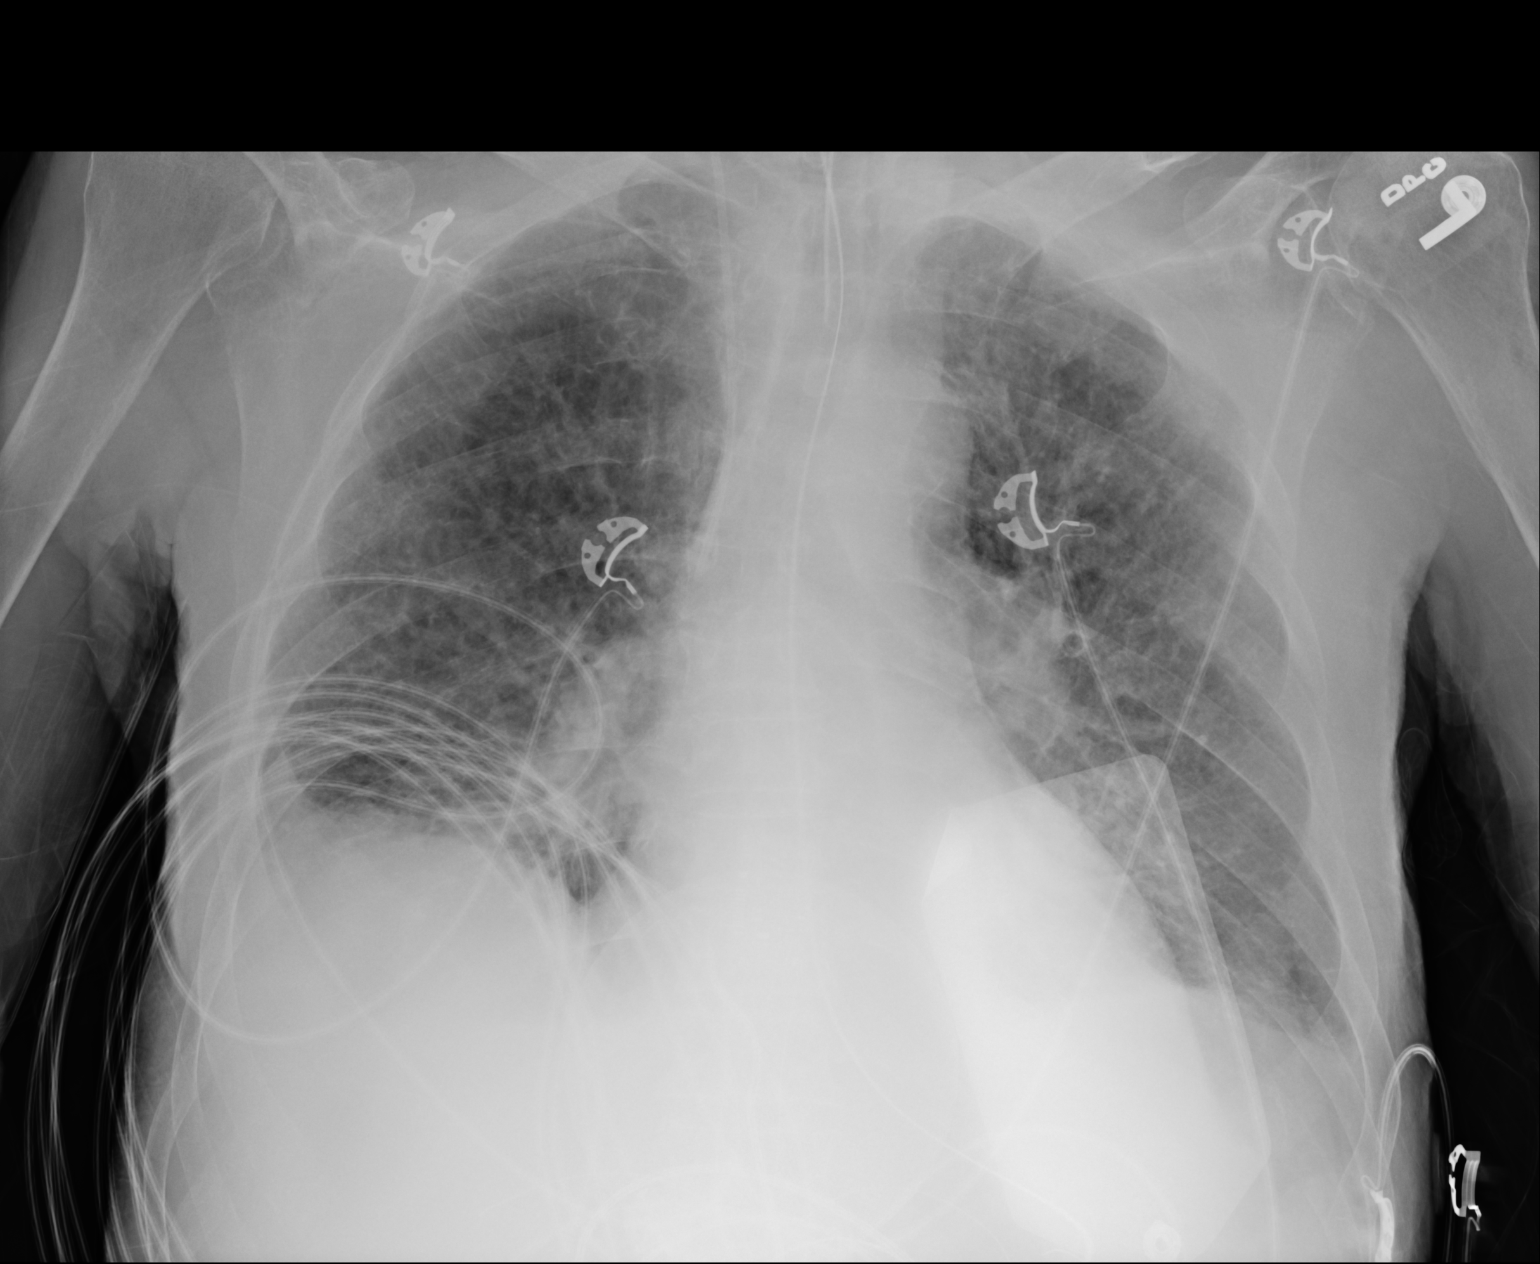
[im 2/2]
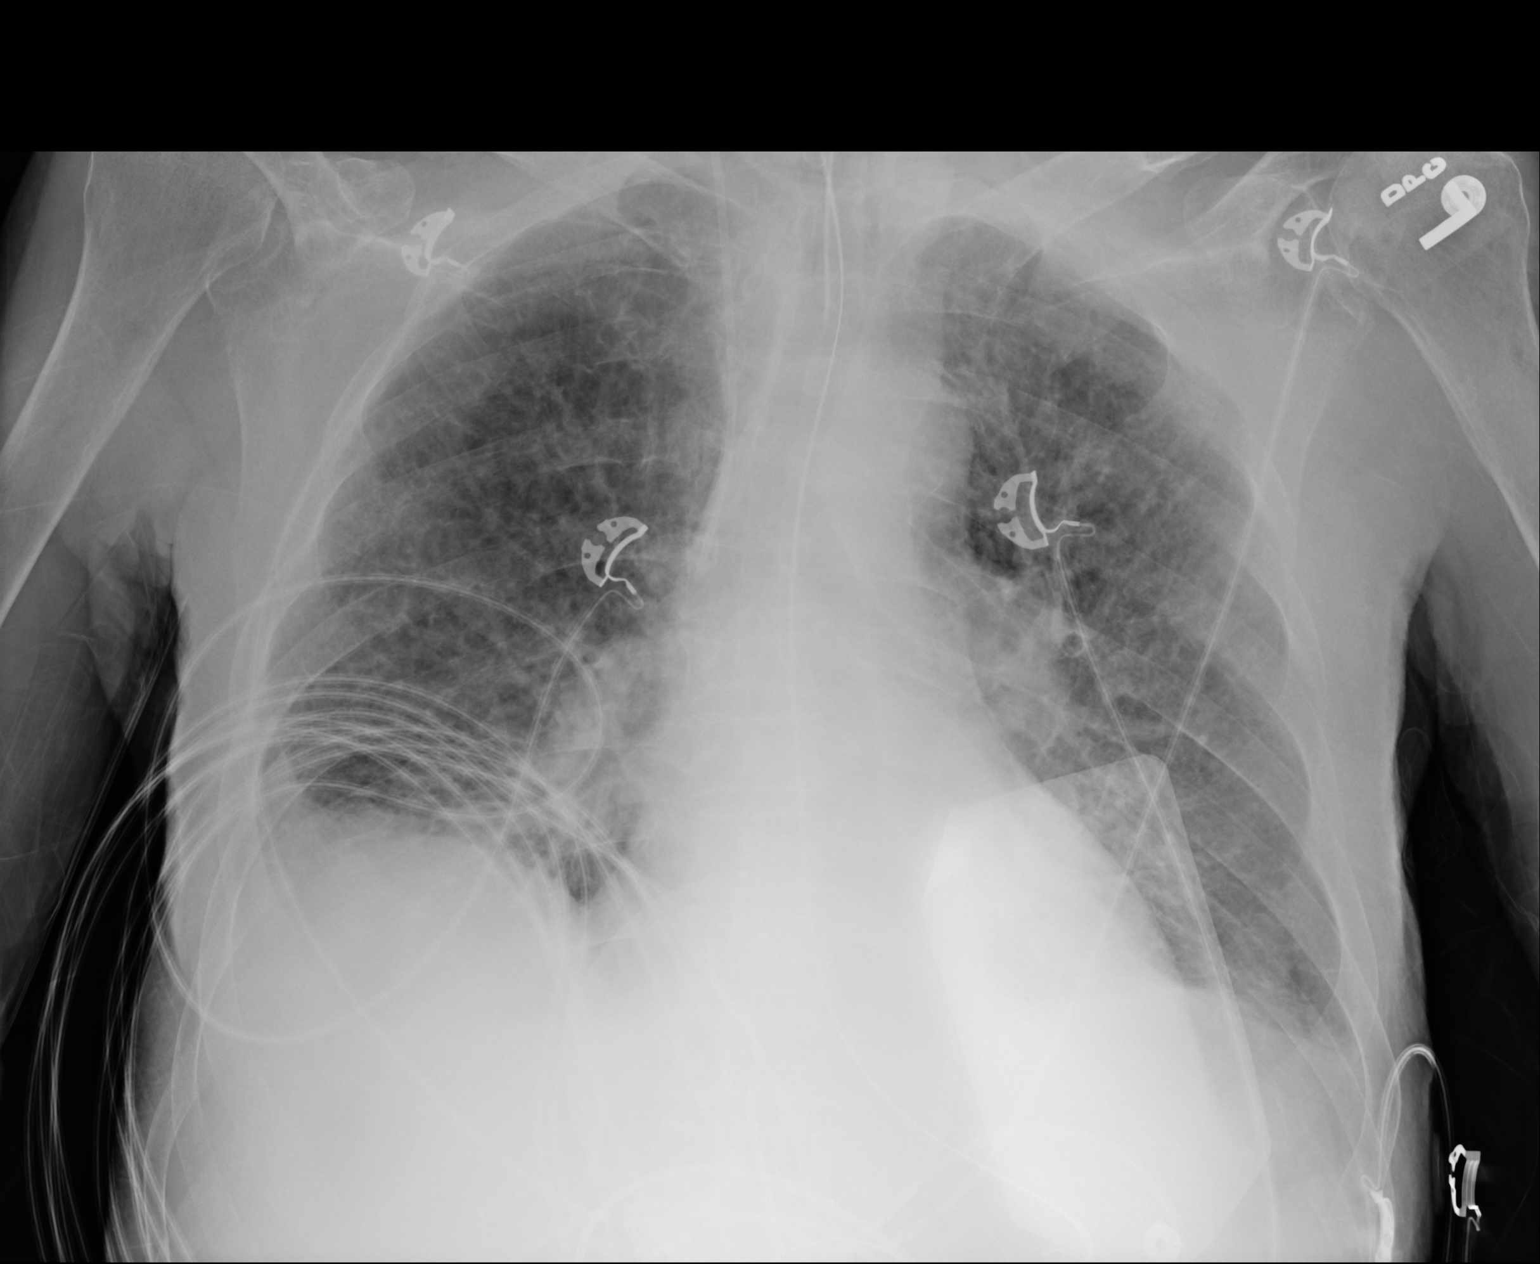

[2 of 2 positions shown; findings below may reference images not displayed]

FINDINGS: The right-sided central venous catheter tip projects over the SVC.
There is no pneumothorax. The endotracheal tube terminates
approximately 6.5 cm above the carina. Cardiac silhouette is
unchanged. Diffuse interstitial lung markings are again noted. There
are developing small bilateral pleural effusions.
IMPRESSION: 1. Well-positioned right-sided central venous catheter without
evidence of a right-sided pneumothorax. Remaining lines and tubes as
above.
2. Stable cardiac silhouette. Prominent interstitial lung markings
are again noted. There are developing small pleural effusions.
Findings are suspicious for developing pulmonary edema and volume
overload.
